# Patient Record
Sex: Female | Born: 1940 | Race: White | Hispanic: No | Marital: Married | Smoking: Never smoker
Health system: Southern US, Community
[De-identification: ages and names within clinical notes are randomized; demographics above are authoritative.]

## PROBLEM LIST (undated history)

## (undated) DIAGNOSIS — H547 Unspecified visual loss: Secondary | ICD-10-CM

## (undated) DIAGNOSIS — H9190 Unspecified hearing loss, unspecified ear: Secondary | ICD-10-CM

## (undated) DIAGNOSIS — R413 Other amnesia: Secondary | ICD-10-CM

## (undated) HISTORY — DX: Unspecified hearing loss, unspecified ear: H91.90

## (undated) HISTORY — DX: Unspecified visual loss: H54.7

## (undated) HISTORY — DX: Other amnesia: R41.3

## (undated) HISTORY — PX: BREAST SURGERY: SHX581

---

## 2014-01-31 ENCOUNTER — Ambulatory Visit (INDEPENDENT_AMBULATORY_CARE_PROVIDER_SITE_OTHER): Payer: Self-pay | Admitting: Internal Medicine

## 2014-01-31 ENCOUNTER — Encounter: Payer: Self-pay | Admitting: Internal Medicine

## 2014-01-31 VITALS — BP 118/78 | HR 60 | Ht 64.0 in | Wt 157.0 lb

## 2014-01-31 DIAGNOSIS — H9190 Unspecified hearing loss, unspecified ear: Secondary | ICD-10-CM

## 2014-01-31 DIAGNOSIS — H9193 Unspecified hearing loss, bilateral: Secondary | ICD-10-CM

## 2014-01-31 DIAGNOSIS — G4730 Sleep apnea, unspecified: Secondary | ICD-10-CM

## 2014-01-31 DIAGNOSIS — R413 Other amnesia: Secondary | ICD-10-CM

## 2014-01-31 NOTE — Patient Instructions (Signed)
Appointment made for sleep apnea consultation with Dr. Shelle Iron October 27. Followup with physician in [place] regarding memory loss

## 2014-01-31 NOTE — Progress Notes (Signed)
   Subjective:    Patient ID: Natalie Williamson, female    DOB: 06/04/1940, 73 y.o.   MRN: [id number]  HPI  73 year old White Female from [place], mother of Ophie Burrowes, not seen since 2009. Patient says she was recently evaluated in [place] for some problems with memory loss. She's been visiting her daughter here and has not returned for lab results yet. She resides in [place], [place]. Her daughter called recently and asked that we evaluate her for possible sleep apnea. Patient says that she's been told by her sister and her daughter that she snores and has episodes of apnea. This was noticed on a trip to [place]. Patient says that some 20 years ago she was told in [place] that she had some episodes of sleep apnea. She was never given a CPAP machine.  Past medical history: Fractured hand 2013. Benign breast biopsy 1969.  No known drug allergies. No chronic medications.  Social history: She is a Investment banker, corporate. She is a widow. Does not smoke or consume alcohol.  Family history: Father died of cancer. Mother died of heart disease and had diabetes. One brother age 32 in good health. One sister whose health status is unknown.  She wears hearing aids.    Review of Systems     Objective:   Physical Exam  Vitals reviewed. Constitutional: She is oriented to person, place, and time. She appears well-developed and well-nourished. No distress.  HENT:  Head: Normocephalic and atraumatic.  Right Ear: External ear normal.  Left Ear: External ear normal.  Mouth/Throat: Oropharynx is clear and moist. No oropharyngeal exudate.  Eyes: Conjunctivae and EOM are normal. Pupils are equal, round, and reactive to light. Right eye exhibits no discharge. Left eye exhibits no discharge. No scleral icterus.  Neck: Neck supple. No JVD present. No thyromegaly present.  Cardiovascular: Normal rate, regular rhythm and normal heart sounds.   No murmur heard. Pulmonary/Chest: Effort normal and  breath sounds normal. No respiratory distress. She has no wheezes. She has no rales. She exhibits no tenderness.  Musculoskeletal: She exhibits no edema.  Lymphadenopathy:    She has no cervical adenopathy.  Neurological: She is alert and oriented to person, place, and time. She has normal reflexes. She displays normal reflexes. No cranial nerve deficit. Coordination normal.  Skin: Skin is warm and dry. She is not diaphoretic.  Psychiatric: She has a normal mood and affect. Her behavior is normal. Judgment and thought content normal.          Assessment & Plan:  Probable sleep apnea based on history  History of memory loss-workup in progress in [place]  Plan: Advise patient to be sure physician in [place] checks B12, folate and thyroid functions. She may need neurological consultation/MRI of brain regarding memory loss. Patient prefers to see consultant here regarding sleep apnea. We'll arrange consultation.  Addendum: Appointment made March 15, 2014  Dr. Shelle Iron

## 2014-03-15 ENCOUNTER — Institutional Professional Consult (permissible substitution): Payer: Self-pay | Admitting: Pulmonary Disease

## 2014-04-25 ENCOUNTER — Telehealth: Payer: Self-pay

## 2014-04-25 NOTE — Telephone Encounter (Signed)
Left message for patient to call office to let us know if/when they received flu vaccine. 

## 2014-05-20 DIAGNOSIS — F0280 Dementia in other diseases classified elsewhere without behavioral disturbance: Secondary | ICD-10-CM

## 2014-05-20 HISTORY — DX: Dementia in other diseases classified elsewhere, unspecified severity, without behavioral disturbance, psychotic disturbance, mood disturbance, and anxiety: F02.80

## 2014-06-14 ENCOUNTER — Encounter: Payer: Self-pay | Admitting: Internal Medicine

## 2014-06-14 ENCOUNTER — Ambulatory Visit (INDEPENDENT_AMBULATORY_CARE_PROVIDER_SITE_OTHER): Payer: Self-pay | Admitting: Internal Medicine

## 2014-06-14 VITALS — BP 128/70 | HR 80 | Temp 98.0°F | Wt 159.0 lb

## 2014-06-14 DIAGNOSIS — G4733 Obstructive sleep apnea (adult) (pediatric): Secondary | ICD-10-CM

## 2014-06-14 DIAGNOSIS — R413 Other amnesia: Secondary | ICD-10-CM

## 2014-06-14 DIAGNOSIS — H9193 Unspecified hearing loss, bilateral: Secondary | ICD-10-CM

## 2014-06-14 LAB — POCT URINALYSIS DIPSTICK
Bilirubin, UA: NEGATIVE
Glucose, UA: NEGATIVE
KETONES UA: NEGATIVE
Leukocytes, UA: NEGATIVE
Nitrite, UA: NEGATIVE
PROTEIN UA: NEGATIVE
RBC UA: NEGATIVE
Spec Grav, UA: 1.015
Urobilinogen, UA: NEGATIVE
pH, UA: 5

## 2014-06-14 LAB — CBC WITH DIFFERENTIAL/PLATELET
Basophils Absolute: 0 10*3/uL (ref 0.0–0.1)
Basophils Relative: 0 % (ref 0–1)
EOS PCT: 1 % (ref 0–5)
Eosinophils Absolute: 0.1 10*3/uL (ref 0.0–0.7)
HCT: 39.3 % (ref 36.0–46.0)
Hemoglobin: 13.2 g/dL (ref 12.0–15.0)
Lymphocytes Relative: 31 % (ref 12–46)
Lymphs Abs: 1.9 10*3/uL (ref 0.7–4.0)
MCH: 29.7 pg (ref 26.0–34.0)
MCHC: 33.6 g/dL (ref 30.0–36.0)
MCV: 88.3 fL (ref 78.0–100.0)
MONO ABS: 0.4 10*3/uL (ref 0.1–1.0)
MONOS PCT: 7 % (ref 3–12)
MPV: 9.5 fL (ref 8.6–12.4)
Neutro Abs: 3.8 10*3/uL (ref 1.7–7.7)
Neutrophils Relative %: 61 % (ref 43–77)
Platelets: 232 10*3/uL (ref 150–400)
RBC: 4.45 MIL/uL (ref 3.87–5.11)
RDW: 14.3 % (ref 11.5–15.5)
WBC: 6.2 10*3/uL (ref 4.0–10.5)

## 2014-06-14 LAB — FOLATE: Folate: 17.6 ng/mL

## 2014-06-14 LAB — COMPREHENSIVE METABOLIC PANEL
ALBUMIN: 4.5 g/dL (ref 3.5–5.2)
ALT: 17 U/L (ref 0–35)
AST: 24 U/L (ref 0–37)
Alkaline Phosphatase: 56 U/L (ref 39–117)
BILIRUBIN TOTAL: 0.7 mg/dL (ref 0.2–1.2)
BUN: 18 mg/dL (ref 6–23)
CO2: 28 mEq/L (ref 19–32)
Calcium: 9.8 mg/dL (ref 8.4–10.5)
Chloride: 102 mEq/L (ref 96–112)
Creat: 0.87 mg/dL (ref 0.50–1.10)
Glucose, Bld: 95 mg/dL (ref 70–99)
Potassium: 4 mEq/L (ref 3.5–5.3)
Sodium: 140 mEq/L (ref 135–145)
Total Protein: 7.3 g/dL (ref 6.0–8.3)

## 2014-06-14 LAB — VITAMIN B12: Vitamin B-12: 578 pg/mL (ref 211–911)

## 2014-06-14 LAB — RPR

## 2014-06-14 LAB — TSH: TSH: 1.13 u[IU]/mL (ref 0.350–4.500)

## 2014-06-14 LAB — T4, FREE: Free T4: 1.1 ng/dL (ref 0.80–1.80)

## 2014-06-15 DIAGNOSIS — G4733 Obstructive sleep apnea (adult) (pediatric): Secondary | ICD-10-CM | POA: Insufficient documentation
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
## 2014-06-15 NOTE — Patient Instructions (Signed)
Await records from [place] for review. Nonfasting labs drawn and are pending.

## 2014-06-17 ENCOUNTER — Telehealth: Payer: Self-pay | Admitting: *Deleted

## 2014-06-17 DIAGNOSIS — R413 Other amnesia: Secondary | ICD-10-CM

## 2014-06-17 NOTE — Telephone Encounter (Signed)
Spoke with Vernona RiegerLaura at [facility] she states they require 2 years of records and any testing done in last 2 years faxed to them for review. We will forward our records and those we received from [place] to them at [phone]. They will review and contact patient and/or family to schedule appt. They are booked into May of this year.

## 2014-07-12 ENCOUNTER — Telehealth: Payer: Self-pay | Admitting: *Deleted

## 2014-07-12 NOTE — Telephone Encounter (Signed)
Annabelle HarmanDana from [facility] at [facility] called they have scheduled patient with Dr Carl BestEd Shaw on 08/22/2014 at 10:30 for eval and treat memory loss

## 2014-09-06 ENCOUNTER — Telehealth: Payer: Self-pay | Admitting: Internal Medicine

## 2014-09-06 NOTE — Telephone Encounter (Signed)
Spoke with daughter, recommended looking online coupons.  Also recommended that they ask the docs at [facility] if they have samples or if perhaps their Pharmaceutical Reps may be able to provide some of the meds since patient is self pay.  The name of the medicine is Exelon Rivastigmine.

## 2014-09-06 NOTE — Telephone Encounter (Signed)
She may be able to get coupon on line

## 2014-09-06 NOTE — Telephone Encounter (Signed)
Daughter, Theodoro Parmaerry Williamson called to advise that patient was seen at [facility].  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  She scored a 27 out of 30 on the test.  She is to [facility] Friday, 4/29 for a Glucose Test; CT Scan to make a definitive diagnosis between the two.  Meanwhile, they started her on a low dosage of an altheimer's drug.  Natalie couldn't remember the exact name of the drug, but it was very expensive.  I asked her if it was Namenda, but she couldn't be certain.  She wanted you to be informed of what is going on.

## 2014-10-18 ENCOUNTER — Telehealth: Payer: Self-pay | Admitting: Internal Medicine

## 2014-10-18 NOTE — Telephone Encounter (Signed)
Was seen this morning at [facility] by Dr. Raynelle FanningJulie (? She cannot remember the last name).  Today is her last day there.  She introduced them to the next doc but she will be leaving before the patient goes back in August (the new doc will be leaving in June).  So, they really do not know who they will be seeing in August when they are to return to the clinic in August for follow up.  Ms. 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  There is no R or Front side involvement.  She has been placed on Axon medication (they MAY switch her to another drug IF this one hurts her stomach); but they don't have a Rx for the other altzheimer's Rx.    Natalie Williamson is wanting to come back to you now because they are not supposed to go back to [facility] until August.  She feels that they have been given a LOT of information today and it has been dumped into their laps.  They don't know how fast the disease could/will progress.  They don't know what to anticipate, what to expect.  They are VERY overwhelmed.  Natalie Williamson wants to know what she should look for (like the next 10 steps) as she progresses.  A timeline...the next 2 years, the next 5 years, etc.  At what point do they take her license?  How do they act proactively?  In a while she won't be able to read or speak.   So, at a point, she won't be able to speak or communicate although she will be able to see (because her brain won't be able to connect due to the disease) -- all information that was just dumped in their laps today.    The reports/information should be sent to you from [facility]....but she wanted to call and keep you informed as well.  She is quite overwhelmed.

## 2014-10-18 NOTE — Telephone Encounter (Signed)
Family needs to call Chairman of Department and discuss these concerns. I do not have any recent reports from [facility]

## 2014-10-18 NOTE — Telephone Encounter (Signed)
Left message on daughter voice mail with instructions to call over to [facility] to that department for clarification of information and any other questions or concerns they may have.

## 2015-04-11 ENCOUNTER — Encounter: Payer: Self-pay | Admitting: Internal Medicine

## 2015-04-11 ENCOUNTER — Ambulatory Visit (INDEPENDENT_AMBULATORY_CARE_PROVIDER_SITE_OTHER): Payer: Self-pay | Admitting: Internal Medicine

## 2015-04-11 VITALS — BP 124/80 | HR 68 | Temp 98.3°F | Resp 20 | Wt 156.0 lb

## 2015-04-11 DIAGNOSIS — T148 Other injury of unspecified body region: Secondary | ICD-10-CM

## 2015-04-11 DIAGNOSIS — T148XXA Other injury of unspecified body region, initial encounter: Secondary | ICD-10-CM

## 2015-04-11 DIAGNOSIS — W5581XA Bitten by other mammals, initial encounter: Secondary | ICD-10-CM

## 2015-04-19 NOTE — Progress Notes (Signed)
   Subjective:    Patient ID: Natalie NajjarPatricia A Williamson, female    DOB: Mar 27, 1941, 74 y.o.   MRN: [id number]  HPI 74 year old White Female, mother of Chrisandra Carotaerry Stefanick Cadieux was taken to [facility] November 16 after suffering multiple animal bites face and neck. Family reported the patient was leaning over to [facility] coworker's dog when dog bit her. Family reported that all vaccinations were up-to-date. Patient denied facial numbness or weakness. Patient has a history of memory loss which is been evaluated at [facility]. She was found to have a large superficial abrasion to the right cheek. There were several linear abrasions to the right lateral neck with several partial-thickness puncture wounds to the sternocleidomastoid muscle belly. None of the wounds required surgical repair. She had a CT angioma of the neck showing normal carotid and vertebral arteries. No evidence of contrast extravasation or arterial injury. Soft tissue swelling noted in right face. She received IV Unasyn in the emergency department. Patient was discharged home on Augmentin for 10 days 500/125. She's done well since that time. She even travel to [facility] [place] [place] to see the final [place] race of the year.    Review of Systems     Objective:   Physical Exam  Abrasion to right cheek and puncture wounds described to right face and right neck as above. No evidence of drainage or redness or secondary infection at this point in time. She is alert and oriented.      Assessment & Plan:  Multiple animal bites right face and neck with no evidence of secondary bacterial infection at this point in time  Plan: Finish course of Augmentin and keep an eye out for infection. Tetanus immunization is up-to-date.  25 minutes spent with patient and daughter.

## 2015-04-19 NOTE — Patient Instructions (Signed)
Watch for infection. Finish course of Augmentin. Call if any concerns or questions.

## 2016-08-13 ENCOUNTER — Telehealth: Payer: Self-pay | Admitting: Internal Medicine

## 2016-08-13 DIAGNOSIS — A09 Infectious gastroenteritis and colitis, unspecified: Secondary | ICD-10-CM

## 2016-08-13 NOTE — Telephone Encounter (Signed)
Previous pharmacy used was [facility] [address] (now a [facility]  Called in Zofran 4mg  #20, no refill.  Take 1 tablet every 8 hours as needed for nausea.  Verbal order by Dr. Lenord FellersBaxley.   Left voicemail for patient's daughter at [phone] with directions for the medication.  And, advised that we do NOT want the patient to become dedydrated this evening.  She is instructed to call the office tomorrow with any questions.

## 2016-08-13 NOTE — Telephone Encounter (Signed)
Call in  Zofran 4 mg #20 tablets one by mouth every 8 hours when necessary nausea. Encourage clear liquids to keep well hydrated.

## 2016-08-13 NOTE — Telephone Encounter (Signed)
Called daughter, Aurther Lofterry to advise that Dr. Lenord FellersBaxley wants to call in Zofran for her Mom.  Left message on her voicemail @ [phone] to please call the office to provide a pharmacy for her Mom so that we can call in a Rx for the nausea for the patient.  Will wait for her call.

## 2016-08-13 NOTE — Telephone Encounter (Signed)
Daughter, Aurther Lofterry called stating that her Mother flew (patient) in last Wednesday.  She "thinks" that patient may have some norovirus or food poisoning.  She got her a lemonade from [employer] last evening.  It sat on the counter from about 6:00 p.m. Until about 9:00 p.m.  She then drank it.  Aurther Lofterry states that it has lemonade and some milk products such as ice cream mixed in it.  Today, patient has vomited 3-4 times this morning and has had a low grade fever.  She has been in the bed for most of the day.  She gave her some motrin.  She wanted to know if she should bring her to the office?  Advised there isn't much we can do for this.  She should stay on a clear liquid diet.  Patient doesn't have any cough or congestion, no other complaints.    Advised that you will not be in the office on Wednesday, but will be in the office on Thursday morning.  Advised that I would document her chart of the conversation.  She just wanted you to be aware in the event that she needed to be seen on Thursday.    FYI.

## 2019-06-09 ENCOUNTER — Encounter: Payer: Self-pay | Admitting: Internal Medicine

## 2019-06-09 ENCOUNTER — Other Ambulatory Visit: Payer: Self-pay

## 2019-06-09 ENCOUNTER — Ambulatory Visit (INDEPENDENT_AMBULATORY_CARE_PROVIDER_SITE_OTHER): Payer: Self-pay | Admitting: Internal Medicine

## 2019-06-09 VITALS — BP 102/60 | HR 65 | Temp 98.0°F | Wt 170.0 lb

## 2019-06-09 DIAGNOSIS — R413 Other amnesia: Secondary | ICD-10-CM

## 2019-06-09 DIAGNOSIS — F419 Anxiety disorder, unspecified: Secondary | ICD-10-CM

## 2019-06-09 MED ORDER — CLONAZEPAM 0.5 MG PO TABS: 0.5000 mg | ORAL_TABLET | Freq: Two times a day (BID) | ORAL | 1 refills | Status: DC | PRN

## 2019-06-09 MED ORDER — DONEPEZIL HCL 10 MG PO TABS: 10.0000 mg | ORAL_TABLET | Freq: Every morning | ORAL | 1 refills | Status: DC

## 2019-06-09 NOTE — Patient Instructions (Addendum)
It was a pleasure to see you again.  Please take Klonopin on an as-needed basis for anxiety/panic disorder.  Aricept has been refilled.  We understand he will be returning back home to [place] in early February.

## 2019-06-09 NOTE — Progress Notes (Signed)
   Subjective:    Patient ID: Natalie Williamson, female    DOB: 19-Jun-1940, 79 y.o.   MRN: [id number]  HPI 79 year old Female not seen since November 2016. Patient resides in [place] and has been visiting her daughter here in [place] recently. Her daughter,Terry Stefan Karen, is a patient here.  Patient and her daughter walked in today asking for refill on generic Aricept.  Patient  was seen here January 2016 and diagnosed with memory loss.  She had work up in [place] for this as well prior to her evaluation here in 2016. History of sleep apnea and wear CPAP device to sleep. History of hearing loss and has hearing aids.Subsequently in 2016 was seen at [facility] and was started on Exelon patch.   She brings empty bottle of generic Aricept with her today. Has recently had some episodes of anxiety- one episode seemed to stem from having issues with CPAP device becoming very anxious and unconsolable. EMS has been called to the home twice but other than elevated BP with agitation and anxiety was found to be stable. BP here is normal today.    Review of Systems apparently sleeps fairly well and appetite is good.  She is pleasant and cooperative.     Objective:   Physical Exam VS 102/60, pulse 65, Temp 98 degrees pulse ox 96% weight 170 pounds.  Pleasant Female in no acute distress.  Knows year, month, oriented to self, recognizes daughter.  She is cooperative and nonagitated.  Skin warm and dry.  Nodes none.  Neck is supple without JVD thyromegaly or carotid bruits.  Chest is clear to auscultation.  Cardiac exam regular rate and rhythm normal S1 and S2 without murmurs or gallops.  No lower extremity edema.  Her affect is pleasant and cooperative.  Occult episode continue with anxious and relates it to being frightened about some issue with her CPAP device as the doctor in [place] told her if she did not wear it she would die.  This seemed to trigger a panic attack.  She does not seem to have frequent  panic attacks according to her daughter.       Assessment & Plan:  History of memory loss status post evaluation at [facility] and also in [place].  Currently being maintained on Aricept 10 mg daily generic and this was refilled.  Anxiety/panic disorder-explained to daughter that this is not unusual with history of memory loss.  Have prescribed Klonopin 0.5 mg to take at onset of anxiety/panic symptoms rather than on a daily basis.  Patient is planning to return to [place] in early February.  30 minutes spent with patient and daughter and reviewing chart, taking history, examining patient, medical decision making

## 2019-08-31 ENCOUNTER — Other Ambulatory Visit: Payer: Self-pay | Admitting: Internal Medicine

## 2019-08-31 ENCOUNTER — Other Ambulatory Visit: Payer: Self-pay

## 2019-08-31 DIAGNOSIS — Z1329 Encounter for screening for other suspected endocrine disorder: Secondary | ICD-10-CM

## 2019-08-31 DIAGNOSIS — R413 Other amnesia: Secondary | ICD-10-CM

## 2019-08-31 DIAGNOSIS — H9190 Unspecified hearing loss, unspecified ear: Secondary | ICD-10-CM

## 2019-08-31 DIAGNOSIS — Z0000 Encounter for general adult medical examination without abnormal findings: Secondary | ICD-10-CM

## 2019-08-31 DIAGNOSIS — G4733 Obstructive sleep apnea (adult) (pediatric): Secondary | ICD-10-CM

## 2019-08-31 DIAGNOSIS — Z13220 Encounter for screening for lipoid disorders: Secondary | ICD-10-CM

## 2019-09-01 LAB — COMPLETE METABOLIC PANEL WITH GFR
AG Ratio: 1.8 (calc) (ref 1.0–2.5)
ALT: 16 U/L (ref 6–29)
AST: 21 U/L (ref 10–35)
Albumin: 4.3 g/dL (ref 3.6–5.1)
Alkaline phosphatase (APISO): 61 U/L (ref 37–153)
BUN/Creatinine Ratio: 25 (calc) — ABNORMAL HIGH (ref 6–22)
BUN: 25 mg/dL (ref 7–25)
CO2: 26 mmol/L (ref 20–32)
Calcium: 9.3 mg/dL (ref 8.6–10.4)
Chloride: 106 mmol/L (ref 98–110)
Creat: 0.99 mg/dL — ABNORMAL HIGH (ref 0.60–0.93)
GFR, Est African American: 63 mL/min/{1.73_m2} (ref >=60)
GFR, Est Non African American: 54 mL/min/{1.73_m2} — ABNORMAL LOW (ref >=60)
Globulin: 2.4 g/dL (calc) (ref 1.9–3.7)
Glucose, Bld: 94 mg/dL (ref 65–99)
Potassium: 5 mmol/L (ref 3.5–5.3)
Sodium: 141 mmol/L (ref 135–146)
Total Bilirubin: 1 mg/dL (ref 0.2–1.2)
Total Protein: 6.7 g/dL (ref 6.1–8.1)

## 2019-09-01 LAB — CBC WITH DIFFERENTIAL/PLATELET
Absolute Monocytes: 396 cells/uL (ref 200–950)
Basophils Absolute: 22 cells/uL (ref 0–200)
Basophils Relative: 0.5 %
Eosinophils Absolute: 79 cells/uL (ref 15–500)
Eosinophils Relative: 1.8 %
HCT: 38.3 % (ref 35.0–45.0)
Hemoglobin: 12.6 g/dL (ref 11.7–15.5)
Lymphs Abs: 1324 cells/uL (ref 850–3900)
MCH: 29.9 pg (ref 27.0–33.0)
MCHC: 32.9 g/dL (ref 32.0–36.0)
MCV: 90.8 fL (ref 80.0–100.0)
MPV: 10.3 fL (ref 7.5–12.5)
Monocytes Relative: 9 %
Neutro Abs: 2578 cells/uL (ref 1500–7800)
Neutrophils Relative %: 58.6 %
Platelets: 205 10*3/uL (ref 140–400)
RBC: 4.22 10*6/uL (ref 3.80–5.10)
RDW: 13.7 % (ref 11.0–15.0)
Total Lymphocyte: 30.1 %
WBC: 4.4 10*3/uL (ref 3.8–10.8)

## 2019-09-01 LAB — LIPID PANEL
Cholesterol: 258 mg/dL — ABNORMAL HIGH (ref <200)
HDL: 63 mg/dL (ref >=50)
LDL Cholesterol (Calc): 175 mg/dL (calc) — ABNORMAL HIGH
Non-HDL Cholesterol (Calc): 195 mg/dL (calc) — ABNORMAL HIGH (ref <130)
Total CHOL/HDL Ratio: 4.1 (calc) (ref <5.0)
Triglycerides: 88 mg/dL (ref <150)

## 2019-09-01 LAB — TSH: TSH: 0.68 mIU/L (ref 0.40–4.50)

## 2019-09-03 ENCOUNTER — Other Ambulatory Visit: Payer: Self-pay

## 2019-09-03 ENCOUNTER — Ambulatory Visit (INDEPENDENT_AMBULATORY_CARE_PROVIDER_SITE_OTHER): Payer: Self-pay | Admitting: Internal Medicine

## 2019-09-03 ENCOUNTER — Encounter: Payer: Self-pay | Admitting: Internal Medicine

## 2019-09-03 VITALS — BP 120/80 | HR 82 | Temp 98.3°F | Ht 63.0 in | Wt 165.0 lb

## 2019-09-03 DIAGNOSIS — Z0000 Encounter for general adult medical examination without abnormal findings: Secondary | ICD-10-CM

## 2019-09-04 ENCOUNTER — Telehealth: Payer: Self-pay | Admitting: Internal Medicine

## 2019-09-04 NOTE — Telephone Encounter (Signed)
Her labs have been reviewed and a copy mailed to her daughter [name] here in [place]. Only concern is cholesterol elevated. She had Covid exposure with her son and has received 2 Covid vaccines but last one was very recent. Therefore, per protocols, we felt it was best not to see her yesterday. I am willing to see her in 2 weeks provided she has no Covid-19 symptoms and that daughter brings her. Have mailed note to daughter with results.

## 2019-09-15 ENCOUNTER — Encounter: Payer: Self-pay | Admitting: Internal Medicine

## 2019-09-15 NOTE — Patient Instructions (Addendum)
Patient not seen due to possible Covid exposure with her son who was hospitalized with COVID-19.  Daughter says she has had 1 Covid vaccine and the most recent Covid vaccine was less than 2 weeks ago.  We did not agree to see you today based on this history.  Her labs have been drawn previously without his knowledge.  Her daughter has been mailed a copy of these labs and we offered to see the patient in 2 weeks if she remains asymptomatic.  Her son was not pleased that we would not see patient today.

## 2019-09-27 ENCOUNTER — Telehealth: Payer: Self-pay | Admitting: Internal Medicine

## 2019-09-27 NOTE — Telephone Encounter (Signed)
error 

## 2019-09-29 ENCOUNTER — Other Ambulatory Visit: Payer: Self-pay

## 2019-09-29 ENCOUNTER — Ambulatory Visit (INDEPENDENT_AMBULATORY_CARE_PROVIDER_SITE_OTHER): Payer: Self-pay | Admitting: Internal Medicine

## 2019-09-29 ENCOUNTER — Ambulatory Visit
Admission: RE | Admit: 2019-09-29 | Discharge: 2019-09-29 | Disposition: A | Discharge status: Home / Self Care | Payer: No Typology Code available for payment source | Source: Ambulatory Visit | Attending: Internal Medicine | Admitting: Internal Medicine

## 2019-09-29 ENCOUNTER — Encounter: Payer: Self-pay | Admitting: Internal Medicine

## 2019-09-29 VITALS — BP 110/60 | HR 68 | Ht 63.0 in | Wt 167.0 lb

## 2019-09-29 DIAGNOSIS — R413 Other amnesia: Secondary | ICD-10-CM

## 2019-09-29 DIAGNOSIS — R8290 Unspecified abnormal findings in urine: Secondary | ICD-10-CM

## 2019-09-29 DIAGNOSIS — E7800 Pure hypercholesterolemia, unspecified: Secondary | ICD-10-CM

## 2019-09-29 DIAGNOSIS — Z0000 Encounter for general adult medical examination without abnormal findings: Secondary | ICD-10-CM

## 2019-09-29 DIAGNOSIS — H903 Sensorineural hearing loss, bilateral: Secondary | ICD-10-CM

## 2019-09-29 DIAGNOSIS — L989 Disorder of the skin and subcutaneous tissue, unspecified: Secondary | ICD-10-CM

## 2019-09-29 DIAGNOSIS — Z23 Encounter for immunization: Secondary | ICD-10-CM

## 2019-09-29 DIAGNOSIS — G4733 Obstructive sleep apnea (adult) (pediatric): Secondary | ICD-10-CM

## 2019-09-29 LAB — POCT URINALYSIS DIPSTICK
Bilirubin, UA: NEGATIVE
Blood, UA: NEGATIVE
Glucose, UA: NEGATIVE
Ketones, UA: NEGATIVE
Nitrite, UA: NEGATIVE
Protein, UA: NEGATIVE
Spec Grav, UA: 1.015 (ref 1.010–1.025)
Urobilinogen, UA: 0.2 E.U./dL
pH, UA: 6 (ref 5.0–8.0)

## 2019-09-29 MED ORDER — ROSUVASTATIN CALCIUM 5 MG PO TABS: 5.0000 mg | ORAL_TABLET | Freq: Every day | ORAL | 3 refills | Status: DC

## 2019-09-29 MED ORDER — MEMANTINE HCL ER 14 MG PO CP24: 14.0000 mg | ORAL_CAPSULE | Freq: Every day | ORAL | 0 refills | Status: DC

## 2019-09-29 NOTE — Patient Instructions (Signed)
Chest x-ray for TB screening for retirement facility is negative.  Pneumococcal 23 vaccine given today.  Form completed for retirement facility.  Urine culture obtained as dipstick UA is abnormal but she has no urinary symptoms.  Continue Aricept 10 mg daily and add Namenda XR 14 mg daily.  Order written for oxygen/CPAP machine to be evaluated by home health/respiratory company.

## 2019-09-29 NOTE — Progress Notes (Signed)
Subjective:    Patient ID: Natalie Williamson, female    DOB: [birth year], 79 y.o.   MRN: [id number]  HPI  79 year old Female, native of [place], currently residing with her daughter,Terry, here in [place] seen today for health maintenance exam and evaluation of medical issues.I initially saw her in 2007-09-19. I last saw her in January 2021.  She has a history of sleep apnea and memory loss. She has a portable oxygen device that she uses for sleep. It may need titration. Order written for evaluation of device by Home Health Oxygen supply company such as [facility].   She had extensive evaluation for memory loss in 05/02/2016at [facility] in [place] by Dr. Mayford Knife.  At that time, she scored 28 out of 30 on MMSE.  She was found to have memory impairment for her age.  She remembered 0 out of 15 words on the AV LLT word list after 30-minute delay.  Performance was 2 standard deviations below the mean for her age.  Her language function was normal for her age and education.  She named 27 out of 30 items on the [employer].  She has had an MRI of the brain in 09-18-2009 showing right knee punctate white matter foci 3 on the right and 1 on the left supratentorial brain and questionable frontal lobe likely related to small vessel ischemic change.  Also has hearing loss and wears bilateral hearing aids.    Subsequently had PET scan at [facility] in 09-19-2014 showing hypometabolism in the left parietal, left posterior temporal, and left lateral occipital lobe.  Had normal glucose metabolism in bilateral frontal lobes, majority of the right hemicortex, medial occipital lobes and left anterior temporal lobe, anterior and posterior cingulate gyri and basal ganglia.  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  She was placed on Exelon 1.5 mg capsules twice daily at [facility] in 2014-09-19 but there was some abdominal discomfort and  she quit taking the medication. I saw her in January 2021 and placed her on Aricept 10 mg daily.  Social history: She is a Clinical research associate.  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  She is a widow.  She has an adult son and an adult daughter who both live in the [facility]- [address] area.  Has been staying with her daughter for the past 6 or 7 months.  Completed 12 years of school.  Husband passed away in 09-18-05.  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  She has never smoked and does not drink alcohol.  Past medical history: History of right hand fracture in 09/19/11.  Family history: History of Alzheimer's disease in paternal grandmother.  Daughter and son feels that patient has lost ground in terms of memory since being here during COVID-19 over the past few months.  Feels that memory is worse.  Confusion at times, issues with retaining information, and sometimes misplacing things needs more assistance.  Does not drive around town.  Review of Systems-intermittent discomfort in her right upper quadrant with change in position.  She feels that there is some sort of muscle issue and that she has to basically place her fist into her right upper quadrant to get relief of a ? Spasm or catch in that area.  Pain is promptly relieved with the maneuver she is able  to perform.  Denies reflux symptoms.  Somewhat hard of hearing despite wearing bilateral hearing aids.  She wears glasses.  She has developed a small papular lesion on her right cheek.  Have arranged for dermatologist to see her next week.  It may need to be shave biopsied.  Daughter prepares her food and helps her with bathing.  She has had no falls.  Denies being depressed and seems content.  Would like to move back to [place].  We discussed whether or not she should be permitted to reside alone.  Certainly a life alert device would be recommended in case she fell while she  was alone.  There is a neighbor next door who has been helpful.  She has also investigated a retirement facility in [place].  There are forms which will be completed today for that facility.  It is not known if she ever had pneumonia vaccines.  Pneumococcal 23 vaccine given today.  She has had 2 COVID-19 vaccines September 01, 2019 and also on August 12, 2019.  She had tetanus immunization November 2016.     Objective:   Physical Exam vital signs temperature 98.3 degrees orally weight 165 pounds loss of 5 pounds since January 2021.  Pulse oximetry 96% on room air.  BMI 29.23 temperature 98.3 degrees orally height 5 feet 3 inches Blood pressure 120/80, pulse 82 and regular  Skin:warm and dry.  Small papular lesion right cheek.  No cervical adenopathy.  TMs and pharynx are clear.  She wears bilateral hearing aids.  Neck is supple without JVD thyromegaly or carotid bruits.  Chest is clear to auscultation.  Cardiac exam regular rate and rhythm normal S1 and S2 without murmurs or gallops.  Breast without masses although there are fibrocystic changes in each breast bilaterally.  Abdomen soft nondistended without hepatosplenomegaly masses or tenderness.  Pap deferred due to age.  Bimanual is normal.  No lower extremity pitting edema.  Neurological exam: She is alert, pleasant and cooperative.  She is unable to do serial 7 calculations.  Unable to remember 3 items after 5 minutes.  Does not he have month or year correctly.  Somewhat hard of hearing despite hearing aids and asked that I repeat myself frequently although we are both wearing masks during the interview.     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  Significant issues with recall of items after a few minutes.  Tried on Exelon but that caused GI disturbance and patient is currently on Aricept 10 mg daily.  As of today, we will start her also on Namenda (memantine)XR 14 mg daily today.  TSH is within  normal limits.  MCV is normal.  There is no anemia.  Creatinine is stable at 0.99.  CBC is normal.  Urine dipstick shows small LE.  Culture was sent.  I do not think she is depressed.  She is pleasant and cooperative.  Pure hypercholesterolemia-total cholesterol is 258 with an LDL of 175.  She eats pretty healthy according to her daughter.  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  Bilateral hearing loss-wears bilateral hearing aids  History of sleep apnea-has oxygen device that is portable and will be checked out by home health/respiratory company that can assist with supplies etc.  Health maintenance: Pneumococcal 23 vaccine given today.  She has had 2 COVID-19 vaccines.  Has never had colonoscopy.  Cologuard would be reasonable  but she is planning to move back to [place].  Also, retirement facility is requesting TB clearance.  Chest x-ray performed today shows no evidence of TB.  Has never had colonoscopy.  Could do 3 Hemoccult cards at home or Cologuard.  These would be reasonable screenings for colon cancer at her age.  Small lesion right cheek-rule out basal cell carcinoma. Could be benign keratosis. Appointment made with dermatologist next week.  Abnormal urine dipstick-culture sent.  Could perhaps not be a good clean-catch.  No urinary symptoms.  5 pound weight loss since January-needs to be monitored

## 2019-09-30 LAB — URINE CULTURE
MICRO NUMBER:: 10469155
SPECIMEN QUALITY:: ADEQUATE

## 2020-05-16 ENCOUNTER — Other Ambulatory Visit: Payer: Self-pay

## 2020-05-16 ENCOUNTER — Telehealth: Payer: Self-pay | Admitting: Internal Medicine

## 2020-05-16 DIAGNOSIS — R142 Eructation: Secondary | ICD-10-CM

## 2020-05-16 DIAGNOSIS — R140 Abdominal distension (gaseous): Secondary | ICD-10-CM

## 2020-05-16 NOTE — Telephone Encounter (Signed)
Is Natalie Williamson here or in [place]?

## 2020-05-16 NOTE — Telephone Encounter (Signed)
Natalie Williamson [phone]  [name] called to say her mom is having really bad gas and is burping for 30-40 minutes to where she almost passes out, after she drinks or eats. They have even changed to almond milk to see if that would help. The burping started about a month ago and has gotten really bad in the last 2 weeks. This is now happening almost every day, she is really concerned about it.

## 2020-05-16 NOTE — Telephone Encounter (Signed)
Patient is staying here and taking turns with her children. Called and spoke with [name], patient will go and have Xray in the morning they will get the Nexium 20 mg and start her on that once a day and schedule office visit next week.

## 2020-05-16 NOTE — Telephone Encounter (Signed)
Scheduled

## 2020-05-17 ENCOUNTER — Ambulatory Visit: Payer: Self-pay | Admitting: Internal Medicine

## 2020-05-17 ENCOUNTER — Telehealth: Payer: Self-pay | Admitting: Internal Medicine

## 2020-05-17 ENCOUNTER — Ambulatory Visit
Admission: RE | Admit: 2020-05-17 | Discharge: 2020-05-17 | Disposition: A | Discharge status: Home / Self Care | Payer: No Typology Code available for payment source | Source: Ambulatory Visit | Attending: Internal Medicine | Admitting: Internal Medicine

## 2020-05-17 DIAGNOSIS — R140 Abdominal distension (gaseous): Secondary | ICD-10-CM

## 2020-05-17 DIAGNOSIS — R142 Eructation: Secondary | ICD-10-CM

## 2020-05-17 NOTE — Telephone Encounter (Signed)
LVM to C/B for results

## 2020-05-17 NOTE — Telephone Encounter (Signed)
[name] called back and will get Miralax and Nexium for patient and we scheduled appointment for 05/29/2020

## 2020-05-17 NOTE — Telephone Encounter (Signed)
-----   Message from Margaree Mackintosh, MD sent at 05/17/2020 10:40 AM EST ----- Natalie Williamson has mild constipation. Let's see if Miralax daily will help- takes 7-10 days. Also OTC Nexium may help burping but she may have a condition known as aerophagia where she is swallowing air but does not realize it.

## 2020-05-29 ENCOUNTER — Encounter: Payer: Self-pay | Admitting: Internal Medicine

## 2020-05-29 ENCOUNTER — Other Ambulatory Visit: Payer: Self-pay

## 2020-05-29 ENCOUNTER — Ambulatory Visit (INDEPENDENT_AMBULATORY_CARE_PROVIDER_SITE_OTHER): Payer: Self-pay | Admitting: Internal Medicine

## 2020-05-29 VITALS — BP 160/80 | HR 86 | Temp 98.6°F | Ht 63.0 in | Wt 160.0 lb

## 2020-05-29 DIAGNOSIS — F458 Other somatoform disorders: Secondary | ICD-10-CM

## 2020-05-29 DIAGNOSIS — R413 Other amnesia: Secondary | ICD-10-CM

## 2020-05-29 DIAGNOSIS — H903 Sensorineural hearing loss, bilateral: Secondary | ICD-10-CM

## 2020-05-29 NOTE — Patient Instructions (Signed)
She is scheduled to return to [place] tomorrow.  I have added Namenda 14 mg daily to Aricept 10 mg daily.  Perhaps physician in [place] can assess her aerophagia.

## 2020-05-29 NOTE — Progress Notes (Signed)
Subjective:    Patient ID: Natalie Williamson, female    DOB: 1941-02-21, 80 y.o.   MRN: [id number]  HPI 80 year old Female visiting family here from [place] with history of memory loss. She was seen here in May 2021 for health maintenance exam and evaluation of medical issues.  She has a history of sleep apnea.  In addition to memory loss, she has hearing loss and wears bilateral hearing aids.  She had extensive evaluation for memory loss in April 2016 at [facility] in [place].  See dictation 01/30/2020 for details.  She was tried on Exelon capsules but apparently had some abdominal discomfort.  In January 2021, placed her on Aricept 10 mg daily.  She is scheduled to return to [place] tomorrow.  She is now living in a facility for elderly individuals and has her own condominium.  However no one checks on her from the facility.  The facility has several levels of care.  She is an independent care.  She is pleasant and cooperative.  Recently has had some issues with frequent deep burping and belching.  She had a KUB recently to rule out constipation on December 29.  She has mild colorectal stool burden.  I placed her on MiraLAX and Nexium to see if these medications would help the burping.  Family did not think these helped very much and in fact she had an episode of fecal incontinence this past weekend.  They stopped giving her these medications.  Her daughter has a video of her burping and belching in the car.  She was on her way to [facility] to see her grandson play hockey.  Sometimes they think it may be an emotional thing.  It is possible she has aerophagia.  It may be an antianxiety medication would be of some help but I would prefer that they obtain that from her [place] physician.  She also had an episode recently a couple of days after her COVID booster where her right hand was stiff for period of time.  I am wondering if she could have had possibly a TIA.  Her son thought  it might be related to the COVID-vaccine.    Review of Systems see above- no issues with behavior.  She is cooperative.     Objective:   Physical Exam Blood pressure 160/80 recheck was 120/80  Pulse 86 regular temperature 98.6 degrees pulse oximetry 98% weight 160 pounds.  She has lost 7 pounds from May 2021.  She is alert and cooperative.  Her neck is supple without JVD thyromegaly or carotid bruits.  Her chest is clear to auscultation.  Cardiac exam regular rate and rhythm.  1/6 to 2/6 systolic ejection murmur.  Abdomen soft nondistended without hepatosplenomegaly masses or tenderness.  No lower extremity pitting edema.  She thinks the year is 2021.       Assessment & Plan:  Belching and burping-?  Aerophagia.  We could refer her to Gastroenterology for evaluation but she is scheduled to go to [place] tomorrow.  We did try Nexium in addition to MiraLAX for this issue which I thought might have some element of constipation and in fact her KUB did show a moderate stool burden.  She is currently not taking these 2 medications.  ?  Right hand stiffness and numbness-?  Etiology unclear but possibly could have been a TIA  History of memory loss-currently on Aricept 10 mg daily.  Add Namenda 14 mg every 24 hours.  Ask family to have physician follow-up with her in [place].  Hearing loss-wears bilateral hearing aids and family would like to have new ones for her.  It would likely be cheaper in [place] given socialized medicine there compared to the [place].

## 2020-10-18 ENCOUNTER — Telehealth: Payer: Self-pay | Admitting: Internal Medicine

## 2020-10-18 NOTE — Telephone Encounter (Signed)
Scheduled labs, have not schedule appointment yet

## 2020-10-18 NOTE — Telephone Encounter (Signed)
Natalie Williamson [phone]  [name] called to say they were going to be placing [name] in [facility] as early as this coming Saturday. However she needs form and mini physical completed first. I had form dropped off, so we could look at it. I did let [name] know we were booked this week and you were out of office next week for continuing education, and I did not know if we could do anything before week after next.

## 2020-10-18 NOTE — Telephone Encounter (Signed)
She needs fasting labs and brief PE. Can we work out something tomorrow so we can have labs and form ready to go Friday? I can come in early.

## 2020-10-19 ENCOUNTER — Other Ambulatory Visit: Payer: Self-pay | Admitting: Internal Medicine

## 2020-10-19 ENCOUNTER — Other Ambulatory Visit: Payer: Self-pay

## 2020-10-19 DIAGNOSIS — E785 Hyperlipidemia, unspecified: Secondary | ICD-10-CM

## 2020-10-19 DIAGNOSIS — R7989 Other specified abnormal findings of blood chemistry: Secondary | ICD-10-CM

## 2020-10-19 DIAGNOSIS — Z0000 Encounter for general adult medical examination without abnormal findings: Secondary | ICD-10-CM

## 2020-10-19 NOTE — Telephone Encounter (Signed)
Schedule appointment?

## 2020-10-20 ENCOUNTER — Ambulatory Visit (INDEPENDENT_AMBULATORY_CARE_PROVIDER_SITE_OTHER): Payer: Self-pay | Admitting: Internal Medicine

## 2020-10-20 ENCOUNTER — Encounter: Payer: Self-pay | Admitting: Internal Medicine

## 2020-10-20 VITALS — BP 110/60 | HR 72 | Ht 62.25 in | Wt 162.0 lb

## 2020-10-20 DIAGNOSIS — H903 Sensorineural hearing loss, bilateral: Secondary | ICD-10-CM

## 2020-10-20 DIAGNOSIS — R413 Other amnesia: Secondary | ICD-10-CM

## 2020-10-20 DIAGNOSIS — Z0289 Encounter for other administrative examinations: Secondary | ICD-10-CM

## 2020-10-20 DIAGNOSIS — Z0000 Encounter for general adult medical examination without abnormal findings: Secondary | ICD-10-CM

## 2020-10-20 LAB — CBC WITH DIFFERENTIAL/PLATELET
Absolute Monocytes: 360 cells/uL (ref 200–950)
Basophils Absolute: 20 cells/uL (ref 0–200)
Basophils Relative: 0.5 %
Eosinophils Absolute: 80 cells/uL (ref 15–500)
Eosinophils Relative: 2 %
HCT: 37.2 % (ref 35.0–45.0)
Hemoglobin: 12.2 g/dL (ref 11.7–15.5)
Lymphs Abs: 1392 cells/uL (ref 850–3900)
MCH: 29.7 pg (ref 27.0–33.0)
MCHC: 32.8 g/dL (ref 32.0–36.0)
MCV: 90.5 fL (ref 80.0–100.0)
MPV: 10.4 fL (ref 7.5–12.5)
Monocytes Relative: 9 %
Neutro Abs: 2148 cells/uL (ref 1500–7800)
Neutrophils Relative %: 53.7 %
Platelets: 191 10*3/uL (ref 140–400)
RBC: 4.11 10*6/uL (ref 3.80–5.10)
RDW: 13.4 % (ref 11.0–15.0)
Total Lymphocyte: 34.8 %
WBC: 4 10*3/uL (ref 3.8–10.8)

## 2020-10-20 LAB — COMPLETE METABOLIC PANEL WITH GFR
AG Ratio: 1.7 (calc) (ref 1.0–2.5)
ALT: 12 U/L (ref 6–29)
AST: 17 U/L (ref 10–35)
Albumin: 4.3 g/dL (ref 3.6–5.1)
Alkaline phosphatase (APISO): 66 U/L (ref 37–153)
BUN/Creatinine Ratio: 21 (calc) (ref 6–22)
BUN: 23 mg/dL (ref 7–25)
CO2: 29 mmol/L (ref 20–32)
Calcium: 9.3 mg/dL (ref 8.6–10.4)
Chloride: 105 mmol/L (ref 98–110)
Creat: 1.09 mg/dL — ABNORMAL HIGH (ref 0.60–0.88)
GFR, Est African American: 56 mL/min/{1.73_m2} — ABNORMAL LOW (ref >=60)
GFR, Est Non African American: 48 mL/min/{1.73_m2} — ABNORMAL LOW (ref >=60)
Globulin: 2.5 g/dL (calc) (ref 1.9–3.7)
Glucose, Bld: 86 mg/dL (ref 65–99)
Potassium: 4.3 mmol/L (ref 3.5–5.3)
Sodium: 142 mmol/L (ref 135–146)
Total Bilirubin: 1.1 mg/dL (ref 0.2–1.2)
Total Protein: 6.8 g/dL (ref 6.1–8.1)

## 2020-10-20 LAB — LIPID PANEL
Cholesterol: 239 mg/dL — ABNORMAL HIGH (ref <200)
HDL: 56 mg/dL (ref >=50)
LDL Cholesterol (Calc): 163 mg/dL (calc) — ABNORMAL HIGH
Non-HDL Cholesterol (Calc): 183 mg/dL (calc) — ABNORMAL HIGH (ref <130)
Total CHOL/HDL Ratio: 4.3 (calc) (ref <5.0)
Triglycerides: 94 mg/dL (ref <150)

## 2020-10-20 LAB — POCT URINALYSIS DIPSTICK
Appearance: NEGATIVE
Bilirubin, UA: NEGATIVE
Blood, UA: NEGATIVE
Glucose, UA: NEGATIVE
Ketones, UA: NEGATIVE
Leukocytes, UA: NEGATIVE
Nitrite, UA: NEGATIVE
Odor: NEGATIVE
Protein, UA: NEGATIVE
Spec Grav, UA: 1.015 (ref 1.010–1.025)
Urobilinogen, UA: 0.2 E.U./dL
pH, UA: 6 (ref 5.0–8.0)

## 2020-10-20 LAB — TSH: TSH: 1.17 mIU/L (ref 0.40–4.50)

## 2020-11-20 NOTE — Patient Instructions (Signed)
Labs reviewed.  She has pure hypercholesterolemia.  Continues to show signs of memory loss which has worsened over the past couple of years.  Form completed for entry to [facility] living facility.  Continues to wear hearing aids for longstanding history of hearing loss.

## 2020-11-20 NOTE — Progress Notes (Signed)
   Subjective:    Patient ID: Natalie Williamson, female    DOB: Feb 17, 1941, 80 y.o.   MRN: [id number]  HPI 80 year old Female with dementia is moving from Assisted Living Facility in [place] to [place] to be closer to her daughter and her son.  Daughter lives here in [place].  A form needs to be completed for her to enter [facility].  She has a history of sleep apnea.  She has a portable oxygen device that she uses for sleep.  She had extensive evaluation for memory loss in 05/05/16at [facility] by Dr. Mayford Knife.  At that time she scored 28 out of 30 on MMSE.  Was found to have memory impairment for her age.  Performance was 2 standard deviations below the mean for her age.  She had PET scan at [facility] 2014/09/22 showing hypermetabolism in the left parietal, left posterior temporal and left lateral occipital lobe.  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  She was placed on Exelon capsules but there was some abdominal discomfort and she quit taking this.  In January 2021, I placed her on Aricept.  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  Husband passed away in Sep 21, 2005.  She has been a regular Charity fundraiser.  She has never smoked and does not drink alcohol.  An adult son and adult daughter both live in the [address] areas.  Patient has a history of hearing loss and wears hearing aids.  History of right hand fracture in 09-22-2011.  Family history: History of Alzheimer's disease in paternal grandmother.  Daughter has felt that memory got worse during the COVID-19 pandemic.  Confusion at times, issues with retaining information and sometimes misplacing things.  Labs reviewed.  Creatinine mildly elevated at 1.09.  She is not anemic.  Total cholesterol 239 with an LDL cholesterol of 163.  HDL is 56 and  triglycerides 94.  TSH is normal.  Dipstick UA is normal.  CBC is normal.  Review of Systems no new complaints     Objective:   Physical Exam She is hard of hearing.  Blood pressure is 110/60 pulse 72 regular pulse oximetry 97% weight 162 pounds BMI 29.39  Skin: Warm and dry.  No cervical adenopathy.  Chest clear to auscultation.  Cardiac exam: Regular rate and rhythm.  Abdomen soft nondistended without hepatosplenomegaly masses or tenderness.  No masses on bimanual exam.  No lower extremity edema.  Neuro no gross focal deficits but definitely has memory issues.  Cannot remember 3 items after 5 minutes.  Trouble with serial sevens.       Assessment & Plan:  Memory loss-has been on Namenda and Aricept in the past.  Defer treatment for physician at [facility] for this  Hearing loss-wears bilateral hearing aids  Pure hypercholesterolemia-daughter does not want her on cholesterol-lowering medication  Plan: Form completed for entry to [facility].

## 2020-12-14 ENCOUNTER — Telehealth: Payer: Self-pay

## 2020-12-14 NOTE — Telephone Encounter (Signed)
Natalie Williamson called states at her mom's last visit she talked to you about her mom's CPAP she said her mom has one but is very old and she doesn't even know whether it works she would like to know about how to get a new one.

## 2020-12-14 NOTE — Telephone Encounter (Signed)
LVM on Natalie Williamson's cell that a referral was placed to [facility] and if she does not here from them in about a week to call them to schedule an appointment, because they are the ones they would need to handle anything to do with CPAP machine and supplies.

## 2020-12-18 ENCOUNTER — Telehealth: Payer: Self-pay | Admitting: Internal Medicine

## 2020-12-18 NOTE — Telephone Encounter (Signed)
Called [name] offered to see patient tomorrow at 10:00am, and [name] said she is going to be in [place] until Thursday evening she asked to be seen on Friday. Appt made.

## 2020-12-18 NOTE — Telephone Encounter (Signed)
Natalie Williamson [phone]  [name] called to say her mom has found a hard bump over her right breast, you can just feel it, not see it.

## 2020-12-22 ENCOUNTER — Other Ambulatory Visit: Payer: Self-pay

## 2020-12-22 ENCOUNTER — Ambulatory Visit (INDEPENDENT_AMBULATORY_CARE_PROVIDER_SITE_OTHER): Payer: Self-pay | Admitting: Internal Medicine

## 2020-12-22 ENCOUNTER — Encounter: Payer: Self-pay | Admitting: Internal Medicine

## 2020-12-22 VITALS — BP 140/80 | HR 78 | Temp 98.6°F | Ht 62.25 in | Wt 163.0 lb

## 2020-12-22 DIAGNOSIS — Z8669 Personal history of other diseases of the nervous system and sense organs: Secondary | ICD-10-CM

## 2020-12-22 DIAGNOSIS — Z1239 Encounter for other screening for malignant neoplasm of breast: Secondary | ICD-10-CM

## 2020-12-22 DIAGNOSIS — L814 Other melanin hyperpigmentation: Secondary | ICD-10-CM

## 2020-12-22 DIAGNOSIS — R413 Other amnesia: Secondary | ICD-10-CM

## 2020-12-22 NOTE — Progress Notes (Signed)
   Subjective:    Patient ID: Natalie Williamson, female    DOB: Dec 01, 1940, 80 y.o.   MRN: [id number]  HPI 80 year old Female seen today for 3 reasons:  Daughter accompanies her today. Patient has memory loss and has recently moved to [place] from a retirement facility in [place]. She is a native of [place] and both her son and daughter reside in this area. She is a widow. The family was involved in [employer] in [place] for a number of years.  Patient has a CPAP device that she obtained in [place]. Daughter and I looked at it today. I cannot tell what setting it is on. She has an appointment with Pulmonary soon. I cannot order a new device or supplies without correct information.She may need a sleep study.  She had an episode recently at [facility] where she became upset. It seemed to have something to do with her bra that was difficult to put on. She also thought a man at breakfast was looking at her inappropriately. She was found in her closet by daughter and appeared very frightened but really could not explain herself very well. Her daughter stayed and played some religious music and eventually Natalie Williamson calmed down.Daughter has arranged for Staff to give her mother medications instead of trusting Natalie Williamson to take her meds correctly. Daughter wondering what to do about her mother's mood. She may be depressed. It may be her dementia has flared up. I have sent in new Rx for Aricept 10 mg daily. She took Namenda last year but that Rx may have expired. She could take both but I am adding Paxil 10 mg daily today to help with agitation and possible depression.  The third reason she is here today is that she has a skin lesion on her right breast upper aspect that is brownich and has an irregular border. [facility] and treatment of this lesion.      Review of Systems see above. No complaint of headache, chest pain ,SOB,  musculoskeletal pain     Objective:   Physical Exam BP 140/80, pulse 78, Temp 98.6 degrees, pulse ox 98% Weight 163 pounds, BMI 29.57  Right breast has brownish irregular lesion upper aspect. Chest is clear. Cor RRR No LE edema. Affect is pleasant and cooperative although slightly confused today and may be confabulating at times.No mass on right breast exam.       Assessment & Plan:  Sleep apnea- to see Pulmonologist for assistance in prescribing correct settings and device for patient. Her equipment is old.  Memory loss- may be developing some behavior issues/ anxiety. Adding Paxil 10 mg daily. Have prescribed Aricept 10 mg daily. See how she does on these for a few weeks and can add Namenda if needed.TSH is normal.  Right [facility]- [facility]. Have ordered mammogram. Do not feel any masses on breast exam. ? Lentigo  Pure hypercholesterolemia- do not treat with lipid lowering med at this time.

## 2020-12-24 NOTE — Progress Notes (Signed)
12/25/20- 79 yoF never smoker for sleep evaluation courtesy of Sharlet Salina, MD with concern of OSA/ CPAP Medical problem list includes OSA, Hearing Loss, Memory Loss, Hypercholesterolemia,  Here with son and daughter who filled out her forms and answered many of the questions.  Epworth score-6 Body weight today- 166 lbs Covid vax- Lives at [facility]. Dr Lenord Fellers had written for O2/ CPAP to be evaluated by DME. Has klonopin 0.5 mg prn panic associated with memory loss.  She had been consistently using a CPAP machine which is about 81 years old, from [place] and based on a [place] sleep study. Family asks about direct purchase of replacement as potentially cheaper than going through DME.  No insurance/ no Medicare. Family described hx snoring before CPAP.  Prior to Admission medications   Medication Sig Start Date End Date Taking? Authorizing Provider  memantine (NAMENDA XR) 14 MG CP24 24 hr capsule Take 1 capsule (14 mg total) by mouth daily. 09/29/19  Yes Baxley, Luanna Cole, MD  donepezil (ARICEPT) 10 MG tablet Take 1 tablet (10 mg total) by mouth every morning. 12/25/20   Margaree Mackintosh, MD  PARoxetine (PAXIL) 10 MG tablet Take 1 tablet (10 mg total) by mouth daily. 12/25/20   Margaree Mackintosh, MD   Past Medical History:  Diagnosis Date   Hearing difficulty    Memory loss    Past Surgical History:  Procedure Laterality Date   BREAST SURGERY     Family History  Problem Relation Age of Onset   Arthritis Mother    Diabetes Mother    Headache Mother    Cancer Father    Social History   Socioeconomic History   Marital status: Married    Spouse name: Not on file   Number of children: Not on file   Years of education: Not on file   Highest education level: Not on file  Occupational History   Not on file  Tobacco Use   Smoking status: Never   Smokeless tobacco: Never  Substance and Sexual Activity   Alcohol use: No   Drug use: No   Sexual activity: Not on file  Other Topics Concern    Not on file  Social History Narrative   Not on file   Social Determinants of Health   Financial Resource Strain: Not on file  Food Insecurity: Not on file  Transportation Needs: Not on file  Physical Activity: Not on file  Stress: Not on file  Social Connections: Not on file  Intimate Partner Violence: Not on file   ROS-see HPI   + = positive Constitutional:    weight loss, night sweats, fevers, chills, fatigue, lassitude. HEENT:    headaches, difficulty swallowing, tooth/dental problems, sore throat,       sneezing, itching, ear ache, nasal congestion, post nasal drip, snoring CV:    chest pain, orthopnea, PND, swelling in lower extremities, anasarca,                                  dizziness, palpitations Resp:   shortness of breath with exertion or at rest.                productive cough,   non-productive cough, coughing up of blood.              change in color of mucus.  wheezing.   Skin:    rash or lesions. GI:  No-   heartburn, indigestion, abdominal pain, nausea, vomiting, diarrhea,                 change in bowel habits, loss of appetite GU: dysuria, change in color of urine, no urgency or frequency.   flank pain. MS:   joint pain, stiffness, decreased range of motion, back pain. Neuro-     nothing unusual Psych:  change in mood or affect.  depression or anxiety.   memory loss.  OBJ- Physical Exam General- Alert, Oriented, Affect-appropriate, Distress- none acute, +obese Skin- rash-none, lesions- none, excoriation- none Lymphadenopathy- none Head- atraumatic            Eyes- Gross vision intact, PERRLA, conjunctivae and secretions clear            Ears- Hearing, canals-normal            Nose- Clear, no-Septal dev, mucus, polyps, erosion, perforation             Throat- Mallampati III , mucosa clear , drainage- none, tonsils- atrophic, +teeth Neck- flexible , trachea midline, no stridor , thyroid nl, carotid no bruit Chest - symmetrical excursion , unlabored            Heart/CV- RRR , no murmur , no gallop  , no rub, nl s1 s2                           - JVD- none , edema- none, stasis changes- none, varices- none           Lung- clear to P&A, wheeze- none, cough- none , dullness-none, rub- none           Chest wall-  Abd-  Br/ Gen/ Rectal- Not done, not indicated Extrem- cyanosis- none, clubbing, none, atrophy- none, strength- nl Neuro- grossly intact to observation

## 2020-12-25 ENCOUNTER — Telehealth: Payer: Self-pay | Admitting: Internal Medicine

## 2020-12-25 ENCOUNTER — Ambulatory Visit (INDEPENDENT_AMBULATORY_CARE_PROVIDER_SITE_OTHER): Payer: No Typology Code available for payment source | Admitting: Internal Medicine

## 2020-12-25 ENCOUNTER — Encounter: Payer: Self-pay | Admitting: Internal Medicine

## 2020-12-25 ENCOUNTER — Other Ambulatory Visit: Payer: Self-pay

## 2020-12-25 VITALS — BP 108/70 | HR 76 | Temp 98.3°F | Ht 63.5 in | Wt 166.2 lb

## 2020-12-25 DIAGNOSIS — R413 Other amnesia: Secondary | ICD-10-CM

## 2020-12-25 DIAGNOSIS — G4733 Obstructive sleep apnea (adult) (pediatric): Secondary | ICD-10-CM

## 2020-12-25 MED ORDER — PAROXETINE HCL 10 MG PO TABS: 10.0000 mg | ORAL_TABLET | Freq: Every day | ORAL | 0 refills | Status: DC

## 2020-12-25 MED ORDER — DONEPEZIL HCL 10 MG PO TABS: 10.0000 mg | ORAL_TABLET | Freq: Every morning | ORAL | 1 refills | Status: DC

## 2020-12-25 NOTE — Patient Instructions (Signed)
Order- schedule home sleep test    dx OSA  Please call us for results and recommendations about 2 weeks after your sleep test. We can help with a prescription so you can get a new machine and supplies on line as discussed.  Our Patient Care Coordinators will contact Emrie Gayle (dtr) at [phone] to schedule this.

## 2020-12-25 NOTE — Telephone Encounter (Signed)
Notified [name] that Dr. Lenord Fellers has sent those prescriptions.

## 2020-12-25 NOTE — Telephone Encounter (Signed)
Natalie Williamson [phone]  [name] called to check on Anxiety and alzheimer medication that was going to be called in from her moms visit on Friday.  [facility] [id number] [place], [place] - [address] Phone:  [phone]  Fax:  [phone]

## 2020-12-26 NOTE — Assessment & Plan Note (Signed)
She lives in assisted living and has supportive family

## 2020-12-26 NOTE — Assessment & Plan Note (Signed)
She had slept well with CPAP. Needs updated study to replace machine. Plan- home sleep test, anticipating she will spend that night at daughter's house so daughter can help her. If she needs CPAP, we can help them get it on-line, rather than DME, as they ask.

## 2020-12-27 NOTE — Patient Instructions (Addendum)
To see Pulmonologist about sleep apnea. [facility] right breast- ? Possible lentigo. Start Paxil 10 mg daily and Aricept 10 mg daily for memory loss and agitation.RTC 3 months. Mammogram ordered.

## 2020-12-28 NOTE — Telephone Encounter (Signed)
Natalie Williamson has called back and said she thought her mom was supposed to also get medication for cholesterol too. She also said that one of the medications that her mom has started on is causing her stomach to be upset. Her mom is thinking it is the Alzheimer mediation.

## 2020-12-28 NOTE — Telephone Encounter (Signed)
Called and told Terrie what Dr Lenord Fellers said and she verbalized understanding

## 2021-02-21 ENCOUNTER — Other Ambulatory Visit: Payer: Self-pay

## 2021-02-21 ENCOUNTER — Ambulatory Visit: Payer: No Typology Code available for payment source

## 2021-02-21 DIAGNOSIS — G4733 Obstructive sleep apnea (adult) (pediatric): Secondary | ICD-10-CM

## 2021-03-02 DIAGNOSIS — G4733 Obstructive sleep apnea (adult) (pediatric): Secondary | ICD-10-CM

## 2021-03-15 ENCOUNTER — Other Ambulatory Visit: Payer: Self-pay | Admitting: Internal Medicine

## 2021-04-03 ENCOUNTER — Telehealth: Payer: Self-pay | Admitting: Internal Medicine

## 2021-04-03 DIAGNOSIS — G4733 Obstructive sleep apnea (adult) (pediatric): Secondary | ICD-10-CM

## 2021-04-03 NOTE — Telephone Encounter (Signed)
Home sleep test showed moderate obstructive sleep apnea with low oxygen levels. I recommend we order new DME, new CPAP autto 5-15, mask of choice, humidifier, supplies, AirView/ card

## 2021-04-03 NOTE — Telephone Encounter (Signed)
Dr Young, please advise on HST results, thanks! 

## 2021-04-04 NOTE — Telephone Encounter (Signed)
I have called the pts daughter and she is aware of CY recs.  She stated that the pt does not have insurance so she is self pay.  She is currently using a cpap that is about 81 years old and does not get any supplies from anyone.  Nothing further is needed.

## 2021-06-07 ENCOUNTER — Telehealth: Payer: Self-pay | Admitting: Internal Medicine

## 2021-06-07 NOTE — Telephone Encounter (Signed)
ATC patient and got patient's daughter [name]. We got a fax from [employer] that she has been set up with CPAP machine and was going to get her scheduled for follow up. Daughter stated that they are self pay so there is no insurance requirement and would like to wait a week or so to see if patient is even going to like it/tolerate it and if so then she will call us back to schedule an appointment. Nothing further needed at this time.

## 2021-07-09 ENCOUNTER — Other Ambulatory Visit: Payer: Self-pay | Admitting: Optometrist

## 2021-07-09 ENCOUNTER — Telehealth: Payer: Self-pay | Admitting: Internal Medicine

## 2021-07-09 DIAGNOSIS — H53461 Homonymous bilateral field defects, right side: Secondary | ICD-10-CM

## 2021-07-09 NOTE — Telephone Encounter (Signed)
Pt daughter called and said they went to the eye doctor and they think that pt has had a mini stroke or multiple and believes that is what has happened with pts eye site and they want to get an MRI, Pt is hoping Dr Renold Genta has any of the scans pt had for her head from 2016. Pt daughter said they are going to send over release for those images. Pts daughters call back if needed is [phone]. She said she just wants to give Dr Renold Genta a heads up on this.

## 2021-07-09 NOTE — Telephone Encounter (Signed)
I called pt daughter back and let her know that we dont have any images to compare because that's what she is wanting done. She said she will call [facility] to see if they have them. I did let her know as well that Dr Renold Genta would not be able to make the comparison and that she would probably refer to a neurologist but that she would need an appt with Dr Renold Genta before she does. Per Dr Renold Genta

## 2021-07-10 ENCOUNTER — Ambulatory Visit
Admission: RE | Admit: 2021-07-10 | Discharge: 2021-07-10 | Disposition: A | Discharge status: Home / Self Care | Payer: No Typology Code available for payment source | Source: Ambulatory Visit | Attending: Optometrist | Admitting: Optometrist

## 2021-07-10 ENCOUNTER — Telehealth: Payer: Self-pay | Admitting: Internal Medicine

## 2021-07-10 ENCOUNTER — Other Ambulatory Visit: Payer: Self-pay

## 2021-07-10 ENCOUNTER — Other Ambulatory Visit: Payer: No Typology Code available for payment source

## 2021-07-10 ENCOUNTER — Encounter: Payer: Self-pay | Admitting: Internal Medicine

## 2021-07-10 DIAGNOSIS — H53461 Homonymous bilateral field defects, right side: Secondary | ICD-10-CM

## 2021-07-10 MED ORDER — GADOBENATE DIMEGLUMINE 529 MG/ML IV SOLN
15.0000 mL | Freq: Once | INTRAVENOUS | Status: AC | PRN
  Administered 2021-07-10: 15 mL via INTRAVENOUS

## 2021-07-10 NOTE — Telephone Encounter (Signed)
Phone call from Dr. Valora Piccolo at [facility] regarding MRI findings negative for stroke. We will see patient on Thursday here for evaluation and Referral to Neurology. Dr. Valora Piccolo making referral to [facility] low vision clinic for visual training.

## 2021-07-10 NOTE — Telephone Encounter (Addendum)
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 I let her know she would need a office visit first. MRI was done at [facility] imaging per request of eye doctor.

## 2021-07-10 NOTE — Telephone Encounter (Signed)
Scheduled appointment

## 2021-07-10 NOTE — Telephone Encounter (Addendum)
Phone call to daughter ,Natalie Williamson and  her son, Harvie Heck. Daughter noticed about a month ago that patient could not see to eat the right side of a sandwich and was bumping into doors on her right. Could not see the handle on right car door either.  Natalie Williamson mentioned this to her  personal eye doctor at [facility]. A visit was advised. Apparently a right hemianopsia was diagnosed but I do not have records from [facility]. Patient had MRI of brain at [facility] today. Awaiting a typed report from Radiology. Family would like Neurology evaluation as soon as possible. Explained we needed to see report. They are hoping to prevent another stroke.  Patient was last seen here in August 2022 and did not have arrythmia at that time.   I will try to get report from [facility] today as well as MRI report.  Addendum: 12:50 pm. No tumor or stroke found on brain MRI. Daughter had been contacted. I will see patient Thursday at noon and will refer to Neurology.

## 2021-07-12 ENCOUNTER — Encounter: Payer: Self-pay | Admitting: Internal Medicine

## 2021-07-12 ENCOUNTER — Ambulatory Visit (INDEPENDENT_AMBULATORY_CARE_PROVIDER_SITE_OTHER): Payer: Self-pay | Admitting: Internal Medicine

## 2021-07-12 ENCOUNTER — Other Ambulatory Visit: Payer: Self-pay

## 2021-07-12 VITALS — BP 126/70 | HR 83 | Temp 97.1°F

## 2021-07-12 DIAGNOSIS — R413 Other amnesia: Secondary | ICD-10-CM

## 2021-07-12 LAB — CBC WITH DIFFERENTIAL/PLATELET
Absolute Monocytes: 344 cells/uL (ref 200–950)
Basophils Absolute: 22 cells/uL (ref 0–200)
Basophils Relative: 0.5 %
Eosinophils Absolute: 39 cells/uL (ref 15–500)
Eosinophils Relative: 0.9 %
HCT: 36.2 % (ref 35.0–45.0)
Hemoglobin: 12.2 g/dL (ref 11.7–15.5)
Lymphs Abs: 1479 cells/uL (ref 850–3900)
MCH: 30.2 pg (ref 27.0–33.0)
MCHC: 33.7 g/dL (ref 32.0–36.0)
MCV: 89.6 fL (ref 80.0–100.0)
MPV: 10.6 fL (ref 7.5–12.5)
Monocytes Relative: 8 %
Neutro Abs: 2417 cells/uL (ref 1500–7800)
Neutrophils Relative %: 56.2 %
Platelets: 211 10*3/uL (ref 140–400)
RBC: 4.04 10*6/uL (ref 3.80–5.10)
RDW: 13.1 % (ref 11.0–15.0)
Total Lymphocyte: 34.4 %
WBC: 4.3 10*3/uL (ref 3.8–10.8)

## 2021-07-12 LAB — COMPLETE METABOLIC PANEL WITH GFR
AG Ratio: 1.5 (calc) (ref 1.0–2.5)
ALT: 10 U/L (ref 6–29)
AST: 16 U/L (ref 10–35)
Albumin: 4.2 g/dL (ref 3.6–5.1)
Alkaline phosphatase (APISO): 67 U/L (ref 37–153)
BUN/Creatinine Ratio: 18 (calc) (ref 6–22)
BUN: 17 mg/dL (ref 7–25)
CO2: 30 mmol/L (ref 20–32)
Calcium: 9.2 mg/dL (ref 8.6–10.4)
Chloride: 107 mmol/L (ref 98–110)
Creat: 0.97 mg/dL — ABNORMAL HIGH (ref 0.60–0.95)
Globulin: 2.8 g/dL (calc) (ref 1.9–3.7)
Glucose, Bld: 89 mg/dL (ref 65–99)
Potassium: 4.1 mmol/L (ref 3.5–5.3)
Sodium: 142 mmol/L (ref 135–146)
Total Bilirubin: 0.9 mg/dL (ref 0.2–1.2)
Total Protein: 7 g/dL (ref 6.1–8.1)
eGFR: 59 mL/min/{1.73_m2} — ABNORMAL LOW (ref >=60)

## 2021-07-12 LAB — B12 AND FOLATE PANEL
Folate: 8.6 ng/mL
Vitamin B-12: 264 pg/mL (ref 200–1100)

## 2021-07-12 LAB — TSH: TSH: 0.69 mIU/L (ref 0.40–4.50)

## 2021-07-12 LAB — T4, FREE: Free T4: 1.1 ng/dL (ref 0.8–1.8)

## 2021-07-12 NOTE — Patient Instructions (Signed)
Labs drawn and pending.  Referral will be made to Neurologist at family request.

## 2021-07-12 NOTE — Progress Notes (Signed)
Subjective:    Patient ID: Natalie Williamson, female    DOB: 11-20-1940, 81 y.o.   MRN: [id number]  HPI 81 year old Female seen today for medical evaluation.  She has been seen recently at [facility] after family noted she was neglecting right side.  Recently had MRI of the brain which was unremarkable for stroke.  She would only eat the left side of a sandwich and had issues finding car door handle on the right.  She is a native of [place] and is a [place].  She resides at [place] at the present time.  Her daughter and son live in the area.  She has her own apartment at [employer].  Memory care has not been suggested to the family yet.  The patient is alert and cooperative.  She is pleasantly demented and does not know day of the week year or month etc.  Dr. Valma Cava at [facility] suggested perhaps patient could be seen at [facility] low vision clinic for visual training.  Family is interested in Neurology referral.  Patient was seen in May 2016 at [place] [facility] at [facility].  Had PET scan showing hypometabolism in left parietal, left posterior temporal and left lateral occipital lobe.  Abnormal glucose metabolism in the bilateral frontal lobes, majority of the right hemicortex, the medial occipital lobes, the left anterior temporal lobe, anterior and posterior cingulate gyri and basal ganglion.  CT angiography of neck done after a dog bite of the right face in November 2016 showed normal carotid and vertebral arteries in the neck and no evidence of arteriosclerotic disease or dissection.  Has soft tissue swelling in the right face compatible with dog bite and laceration.  She was tried on Exelon patch in 08-14-14.  Subsequently returned to [place] to live.  More recently before moving to [place] was in a similar living situation in [place] with an apartment in a retirement facility.  During the pandemic, daughter went back to [place] and  brought patient here to [place].  Social history: She is a widow.  Daughter resides here in [place] and son resides in the [place] area.  Family previously participated in [place] racing.  Her husband died in 08-13-05.  History of obstructive sleep apnea and started CPAP in 08-14-14 but most recently was seen at pulmonary for an adjustment and new mask.  She wears bilateral hearing aids.  Had hand fracture in 08/14/2011.  Was diagnosed with B12 deficiency in 13-Aug-2012 and treated with oral supplementation.  Social history: Completed 12 years of education.  Husband passed away in 08-13-05.  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  Has never smoked and does not drink alcohol.  Family was involved in [place] racing.  Family history: Reported Alzheimer's disease in paternal grandmother.  MRI of the brain on February 21 showed no acute infarction hemorrhage hydrocephalus or mass lesion.  Mild to moderate T2 flair hyperintensities in the white matter compatible with chronic microvascular disease.    Review of Systems see above     Objective:   Physical Exam She is pleasant and cooperative but I am not sure she realizes that I am her primary care physician. Blood pressure is excellent.  Pulse 83 regular pulse oximetry 98%.  Skin is warm and dry.  No cervical adenopathy.  Chest clear.  No lower extremity edema.  She follows commands fairly well.      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  Labs drawn and pending including B12, folate, free T4 TSH CBC and c-Met

## 2021-07-23 ENCOUNTER — Ambulatory Visit (INDEPENDENT_AMBULATORY_CARE_PROVIDER_SITE_OTHER): Payer: Self-pay | Admitting: Neurology

## 2021-07-23 ENCOUNTER — Encounter: Payer: Self-pay | Admitting: Neurology

## 2021-07-23 VITALS — BP 155/78 | HR 76 | Ht 63.5 in | Wt 163.8 lb

## 2021-07-23 DIAGNOSIS — R414 Neurologic neglect syndrome: Secondary | ICD-10-CM

## 2021-07-23 DIAGNOSIS — G4733 Obstructive sleep apnea (adult) (pediatric): Secondary | ICD-10-CM

## 2021-07-23 DIAGNOSIS — G319 Degenerative disease of nervous system, unspecified: Secondary | ICD-10-CM

## 2021-07-23 DIAGNOSIS — R413 Other amnesia: Secondary | ICD-10-CM

## 2021-07-23 MED ORDER — MEMANTINE HCL ER 7 MG PO CP24: ORAL_CAPSULE | ORAL | 5 refills | Status: DC

## 2021-07-23 NOTE — Progress Notes (Signed)
[facility]  PATIENT: Natalie Williamson DOB: 10/06/1940  REFERRING DOCTOR OR PCP: Tedra Senegal, MD SOURCE: Patient, notes from primary care, imaging and lab reports, MRI images personally reviewed.  _________________________________   HISTORICAL  CHIEF COMPLAINT:  Chief Complaint  Patient presents with   New Patient (Initial Visit)    Rm 1, w daughter and son. Internal referral for memory concerns and decreased vision. Dx w Alzheimer in 2016. Pt is here for Alzhemiers management and discuss care and treatment options. MMSE:4    HISTORY OF PRESENT ILLNESS:  I had the pleasure of seeing your patient, Natalie Williamson, at [facility] for neurologic consultation regarding her memory concerns and visual disturbance.  She is an 81 year old woman who notes some problems with memory and other cognitive changes.    Her daughter notes she had difficuly with memory in 2016.   She went to [facility] and had a PET scan.   It showed hypometabolism in the left parietal, left posterior temporal, and left lateral occipital lobe is concerning for posterior cerebral atrophy.    At that time, shescored 26/30 on the MMSE at that time.      Since then, she has had progressive difficuty with cognition .   Over thepast few months she is ignoring the right visual field.    MRI showed no stroke that could explain it.    She had been on donepezil i and rivastigmine n the past but had GI issues.   Memantine was prescribed in the past but she has not taken it.  She feels she sleeps well at night.  She sometimes takes naps.     She denies depression.       She has no other significant medical problems.    She eats well.     MMSE - Mini Mental State Exam 07/23/2021  Orientation to time 0  Orientation to Place 1  Registration 0  Attention/ Calculation 0  Recall 0  Language- name 2 objects 2  Language- repeat 0  Language- follow 3 step command 1  Language- read &  follow direction 0  Write a sentence 0  Copy design 0  Total score 4     IMAGES Personally reviewed: MRI brain 07/10/2021 showed mild to moderate chronic microvascular ischemic changes.   Mild atrophy.   No acute findings.    REVIEW OF SYSTEMS: Constitutional: No fevers, chills, sweats, or change in appetite Eyes: No visual changes, double vision, eye pain Ear, nose and throat: No hearing loss, ear pain, nasal congestion, sore throat Cardiovascular: No chest pain, palpitations Respiratory:  No shortness of breath at rest or with exertion.   No wheezes GastrointestinaI: No nausea, vomiting, diarrhea, abdominal pain, fecal incontinence Genitourinary:  No dysuria, urinary retention or frequency.  No nocturia. Musculoskeletal:  No neck pain, back pain Integumentary: No rash, pruritus, skin lesions Neurological: as above Psychiatric: No depression at this time.  No anxiety Endocrine: No palpitations, diaphoresis, change in appetite, change in weigh or increased thirst Hematologic/Lymphatic:  No anemia, purpura, petechiae. Allergic/Immunologic: No itchy/runny eyes, nasal congestion, recent allergic reactions, rashes  ALLERGIES: Allergies  Allergen Reactions   Other Hives    Heat    HOME MEDICATIONS:  Current Outpatient Medications:    memantine (NAMENDA XR) 7 MG CP24 24 hr capsule, One to three a day as tolerated., Disp: 90 capsule, Rfl: 5  PAST MEDICAL HISTORY: Past Medical History:  Diagnosis Date   Alzheimer disease ([place]) 2016  Hearing difficulty    Memory loss    Vision loss     PAST SURGICAL HISTORY: Past Surgical History:  Procedure Laterality Date   BREAST SURGERY      FAMILY HISTORY: Family History  Problem Relation Age of Onset   Arthritis Mother    Diabetes Mother    Headache Mother    Cancer Father     SOCIAL HISTORY:  Social History   Socioeconomic History   Marital status: Married    Spouse name: Not on file   Number of children: Not on file    Years of education: Not on file   Highest education level: High school graduate  Occupational History   Not on file  Tobacco Use   Smoking status: Never   Smokeless tobacco: Never  Substance and Sexual Activity   Alcohol use: No   Drug use: No   Sexual activity: Not on file  Other Topics Concern   Not on file  Social History Narrative   Lives alone in [place]   Right handed   Social Determinants of Health   Financial Resource Strain: Not on file  Food Insecurity: Not on file  Transportation Needs: Not on file  Physical Activity: Not on file  Stress: Not on file  Social Connections: Not on file  Intimate Partner Violence: Not on file     PHYSICAL EXAM  Vitals:   07/23/21 1117  BP: (!) 155/78  Pulse: 76  Weight: 163 lb 12.8 oz (74.3 kg)  Height: 5' 3.5" (1.613 m)    Body mass index is 28.56 kg/m.   General: The patient is well-developed and well-nourished and in no acute distress  HEENT:  Head is [place]/AT.  Sclera are anicteric.   Neck: No carotid bruits are noted.  The neck is nontender.  Cardiovascular: The heart has a regular rate and rhythm with a normal S1 and S2. There were no murmurs, gallops or rubs.    Skin: Extremities are without rash or  edema.  Musculoskeletal:  Back is nontender  Neurologic Exam  Mental status: The patient is alert and oriented x 3 at the time of the examination. The patient has apparent normal recent and remote memory, with an apparently normal attention span and concentration ability.   Speech is normal.  Cranial nerves: Extraocular movements are full. Pupils are equal, round, and reactive to light and accomodation.  She has reduced visual field on the right and has a denser right visual neglect.  Facial symmetry is present. There is good facial sensation to soft touch bilaterally.Facial strength is normal.  Trapezius and sternocleidomastoid strength is normal. No dysarthria is noted.  The tongue is midline, and the patient  has symmetric elevation of the soft palate. No obvious hearing deficits are noted.  Motor:  Muscle bulk is normal.   Tone is normal. Strength is  5 / 5 in all 4 extremities.   Sensory: She has a right sensory neglect.  Sensory testing is intact to pinprick, soft touch and vibration sensation in all 4 extremities.  Coordination: Cerebellar testing reveals good finger-nose-finger and heel-to-shin bilaterally.  Gait and station: Station is normal.   Gait is normal for age.  Tandem gait is mildly wide but likely normal for age Romberg is negative.   Reflexes: Deep tendon reflexes are symmetric and normal bilaterally.   Plantar responses are flexor.    DIAGNOSTIC DATA (LABS, IMAGING, TESTING) - I reviewed patient records, labs, notes, testing and imaging myself where available.  Lab Results  Component Value Date   WBC 4.3 07/12/2021   HGB 12.2 07/12/2021   HCT 36.2 07/12/2021   MCV 89.6 07/12/2021   PLT 211 07/12/2021      Component Value Date/Time   NA 142 07/12/2021 1251   K 4.1 07/12/2021 1251   CL 107 07/12/2021 1251   CO2 30 07/12/2021 1251   GLUCOSE 89 07/12/2021 1251   BUN 17 07/12/2021 1251   CREATININE 0.97 (H) 07/12/2021 1251   CALCIUM 9.2 07/12/2021 1251   PROT 7.0 07/12/2021 1251   ALBUMIN 4.5 06/14/2014 1214   AST 16 07/12/2021 1251   ALT 10 07/12/2021 1251   ALKPHOS 56 06/14/2014 1214   BILITOT 0.9 07/12/2021 1251   GFRNONAA 48 (L) 10/19/2020 0912   GFRAA 56 (L) 10/19/2020 0912   Lab Results  Component Value Date   CHOL 239 (H) 10/19/2020   HDL 56 10/19/2020   LDLCALC 163 (H) 10/19/2020   TRIG 94 10/19/2020   CHOLHDL 4.3 10/19/2020   No results found for: HGBA1C Lab Results  Component Value Date   VITAMINB12 264 07/12/2021   Lab Results  Component Value Date   TSH 0.69 07/12/2021       ASSESSMENT AND PLAN  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  Over the last year she has had more difficulties with right visual field disturbance and neglect.  On exam, she also has sensory neglect on the right.  Interestingly, she had an FDG glucose PET scan in 2016 that showed hypometabolism in the left occipital, parietal and temporal lobes.  Her MRI shows atrophy is mildly asymmetrical, more on the left.  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  This is a less common variant of Alzheimer's disease that primarily asymmetrically affects the parietal and occipital lobes.  Unfortunately, as with Alzheimer's disease, response to therapy is usually mild.  We had a discussion about therapeutic options including but can you Mab which recently got accelerated approval from the FDA.  Unfortunately, her MMSE score is too advanced to be a candidate.  Additionally the risks may not justify the benefit.  I will have her try memantine..  They will return to see me in about 6 months or sooner if there are new or worsening neurologic symptoms.  We also discussed the importance of safety.  Although she is physically doing well her right-sided neglect places her at an increased risk of falls.  Additional supervision either with help in the home or a memory unit might be appropriate.  Thank you for asking me to see Natalie Williamson.  Please let me know if I can be of further assistance with her or other patients in the future.    Jameson Morrow A. Felecia Shelling, MD, [facility] [id number], [id number] PM Certified in Neurology, Clinical Neurophysiology, Sleep Medicine and Neuroimaging  [facility] [address] [place], [place] [address] [phone]

## 2022-01-30 ENCOUNTER — Encounter: Payer: Self-pay | Admitting: Neurology

## 2022-01-30 ENCOUNTER — Ambulatory Visit (INDEPENDENT_AMBULATORY_CARE_PROVIDER_SITE_OTHER): Payer: Self-pay | Admitting: Neurology

## 2022-01-30 VITALS — BP 157/57 | HR 78 | Ht 63.0 in | Wt 165.4 lb

## 2022-01-30 DIAGNOSIS — R413 Other amnesia: Secondary | ICD-10-CM

## 2022-01-30 DIAGNOSIS — G319 Degenerative disease of nervous system, unspecified: Secondary | ICD-10-CM

## 2022-01-30 DIAGNOSIS — R414 Neurologic neglect syndrome: Secondary | ICD-10-CM

## 2022-01-30 NOTE — Progress Notes (Signed)
[facility]  PATIENT: Natalie Williamson DOB: [birth year]  REFERRING DOCTOR OR PCP: Marlan Palau, MD SOURCE: Patient, notes from primary care, imaging and lab reports, MRI images personally reviewed.  _________________________________   HISTORICAL  CHIEF COMPLAINT:  Chief Complaint  Patient presents with   Follow-up    Pt in Rm#2 with daughter and son in the room. Pt state daughter her mom memories, eye se and hear has decreased since the last time been seen.    HISTORY OF PRESENT ILLNESS:  Natalie Williamson is an 81 year old woman who notes some problems with memory and other cognitive changes.     UPDATE 01/30/2022 Since the last visit, family notes continued progression.  Memantine caused hallucinations and   She stays active and walks about one hour once or twice a day.   She lives at [employer] and has some socialization.   She says she eats fairly healthy and has not gained any weight.  She thinks she stays asleep all night.   She occasionally takes naps.   She denies depression or anxiety.  Occasionally she mistakes what she sees as something else.  It's never scary or leading to agitation.  She has right > left visual neglext.  No auditory hallucinations.   She has trouble seeing everything on a table.    We checked MRI brain 07/10/2021.   It showed mild to moderate generalized atrophy and mild ronic microvascular ischemic changes.        Cognitive History Her daughter notes she had difficuly with memory in 2016.   She went to [facility] and had a PET scan.   It showed hypometabolism in the left parietal, left posterior temporal, and left lateral occipital lobe is concerning for posterior cerebral atrophy.    At that time, she scored 26/30 on the MMSE .    Symptoms slow;y progressed.   In 2022, she was noted to neglect the right visual field.    MRI showed no stroke that could explain it.    She had been on donepezil in and rivastigmine n the past  but had GI issues.   Memantine was prescribed in the past but she has not taken it.      07/23/2021   11:33 AM  MMSE - Mini Mental State Exam  Orientation to time 0  Orientation to Place 1  Registration 0  Attention/ Calculation 0  Recall 0  Language- name 2 objects 2  Language- repeat 0  Language- follow 3 step command 1  Language- read & follow direction 0  Write a sentence 0  Copy design 0  Total score 4     IMAGES Personally reviewed: MRI brain 07/10/2021 showed mild to moderate chronic microvascular ischemic changes.   Mild atrophy.   No acute findings.    REVIEW OF SYSTEMS: Constitutional: No fevers, chills, sweats, or change in appetite Eyes: No visual changes, double vision, eye pain Ear, nose and throat: No hearing loss, ear pain, nasal congestion, sore throat.  As above Cardiovascular: No chest pain, palpitations Respiratory:  No shortness of breath at rest or with exertion.   No wheezes GastrointestinaI: No nausea, vomiting, diarrhea, abdominal pain, fecal incontinence Genitourinary:  No dysuria, urinary retention or frequency.  No nocturia. Musculoskeletal:  No neck pain, back pain Integumentary: No rash, pruritus, skin lesions Neurological: as above Psychiatric: No depression at this time.  No anxiety Endocrine: No palpitations, diaphoresis, change in appetite, change in weigh or increased thirst Hematologic/Lymphatic:  No anemia,  purpura, petechiae. Allergic/Immunologic: No itchy/runny eyes, nasal congestion, recent allergic reactions, rashes  ALLERGIES: Allergies  Allergen Reactions   Other Hives    Heat    HOME MEDICATIONS:  Current Outpatient Medications:    memantine (NAMENDA XR) 7 MG CP24 24 hr capsule, One to three a day as tolerated. (Patient not taking: Reported on 01/30/2022), Disp: 90 capsule, Rfl: 5  PAST MEDICAL HISTORY: Past Medical History:  Diagnosis Date   Alzheimer disease (HCC) 2016   Hearing difficulty    Memory loss    Vision  loss     PAST SURGICAL HISTORY: Past Surgical History:  Procedure Laterality Date   BREAST SURGERY      FAMILY HISTORY: Family History  Problem Relation Age of Onset   Arthritis Mother    Diabetes Mother    Headache Mother    Cancer Father     SOCIAL HISTORY:  Social History   Socioeconomic History   Marital status: Married    Spouse name: Not on file   Number of children: Not on file   Years of education: Not on file   Highest education level: High school graduate  Occupational History   Not on file  Tobacco Use   Smoking status: Never   Smokeless tobacco: Never  Substance and Sexual Activity   Alcohol use: No   Drug use: No   Sexual activity: Not on file  Other Topics Concern   Not on file  Social History Narrative   Lives alone in [place]   Right handed   Social Determinants of Health   Financial Resource Strain: Not on file  Food Insecurity: Not on file  Transportation Needs: Not on file  Physical Activity: Not on file  Stress: Not on file  Social Connections: Not on file  Intimate Partner Violence: Not on file     PHYSICAL EXAM  Vitals:   01/30/22 1339 01/30/22 1344  BP: (!) 162/66 (!) 157/57  Pulse: 79 78  Weight: 165 lb 6.4 oz (75 kg)   Height: 5\' 3"  (1.6 m)     Body mass index is 29.3 kg/m.   General: The patient is well-developed and well-nourished and in no acute distress  HEENT:  Head is Jermyn/AT.  Sclera are anicteric.   Skin: Extremities are without rash or  edema.  Musculoskeletal:  Back is nontender  Neurologic Exam  Mental status: She is alert and oriented to name.  Reduced focus/attention.  Reduced short-term memory.   Speech is normal.  Vision: She has reduced visual field on the right and has a denser right visual neglect.  Cranial nerves: Extraocular movements are full. Pupils are equal, round, and reactive to light and accomodation.    Facial symmetry is present. There is good facial sensation to soft touch  bilaterally.Facial strength is normal.  Trapezius and sternocleidomastoid strength is normal. No dysarthria is noted.    Mild hearing deficits are noted.  Motor:  Muscle bulk is normal.   Tone is normal. Strength is  5 / 5 in all 4 extremities.   Sensory: She has a right sensory neglect.  Sensory testing is intact to pinprick, soft touch and vibration sensation in all 4 extremities.  Coordination: Cerebellar testing reveals good finger-nose-finger and heel-to-shin bilaterally.  Gait and station: Station is normal.   Gait is normal for age.  Tandem gait is mildly wide but likely normal for age Romberg is negative.   Reflexes: Deep tendon reflexes are symmetric and normal bilaterally.  DIAGNOSTIC DATA (LABS, IMAGING, TESTING) - I reviewed patient records, labs, notes, testing and imaging myself where available.  Lab Results  Component Value Date   WBC 4.3 07/12/2021   HGB 12.2 07/12/2021   HCT 36.2 07/12/2021   MCV 89.6 07/12/2021   PLT 211 07/12/2021      Component Value Date/Time   NA 142 07/12/2021 1251   K 4.1 07/12/2021 1251   CL 107 07/12/2021 1251   CO2 30 07/12/2021 1251   GLUCOSE 89 07/12/2021 1251   BUN 17 07/12/2021 1251   CREATININE 0.97 (H) 07/12/2021 1251   CALCIUM 9.2 07/12/2021 1251   PROT 7.0 07/12/2021 1251   ALBUMIN 4.5 06/14/2014 1214   AST 16 07/12/2021 1251   ALT 10 07/12/2021 1251   ALKPHOS 56 06/14/2014 1214   BILITOT 0.9 07/12/2021 1251   GFRNONAA 48 (L) 10/19/2020 0912   GFRAA 56 (L) 10/19/2020 0912   Lab Results  Component Value Date   CHOL 239 (H) 10/19/2020   HDL 56 10/19/2020   LDLCALC 163 (H) 10/19/2020   TRIG 94 10/19/2020   CHOLHDL 4.3 10/19/2020   No results found for: "HGBA1C" Lab Results  Component Value Date   VITAMINB12 264 07/12/2021   Lab Results  Component Value Date   TSH 0.69 07/12/2021       ASSESSMENT AND PLAN  Posterior cortical atrophy (HCC)  Memory loss  Right-sided visual neglect   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 No specific treatment.   She could not tolerates some AD medication Stay active and try to eat healthy Rtc 1 year or sooner if new or worsening symptoms.   Yissel Habermehl A. Epimenio Foot, MD, [facility] 01/30/2022, 2:13 PM Certified in Neurology, Clinical Neurophysiology, Sleep Medicine and Neuroimaging  [facility] [address] [place], [place] [address] [phone]

## 2022-03-21 ENCOUNTER — Telehealth: Payer: Self-pay | Admitting: Internal Medicine

## 2022-03-21 NOTE — Telephone Encounter (Signed)
Maryclaire Stoecker [phone]  Coralyn Mark called to say they are working on getting green cared for [facility] and she needs to get vaccines or proof of. They would like to get what they can here. I did suggest that it would be cheaper and possible free at [facility] department. She said she would check into that.  Tetanus -- not due till 04/04/2025 Hep B MMR Veracella Flu - She has had a facility recently

## 2022-03-22 ENCOUNTER — Encounter: Payer: Self-pay | Admitting: Internal Medicine

## 2022-03-22 ENCOUNTER — Ambulatory Visit (INDEPENDENT_AMBULATORY_CARE_PROVIDER_SITE_OTHER): Payer: Self-pay | Admitting: Internal Medicine

## 2022-03-22 VITALS — BP 122/68 | HR 71 | Temp 98.3°F | Ht 63.0 in | Wt 163.3 lb

## 2022-03-22 DIAGNOSIS — Z7185 Encounter for immunization safety counseling: Secondary | ICD-10-CM

## 2022-03-22 DIAGNOSIS — H903 Sensorineural hearing loss, bilateral: Secondary | ICD-10-CM

## 2022-03-22 DIAGNOSIS — Z23 Encounter for immunization: Secondary | ICD-10-CM

## 2022-03-22 DIAGNOSIS — R413 Other amnesia: Secondary | ICD-10-CM

## 2022-03-22 NOTE — Patient Instructions (Addendum)
Vaccine given- Influenza and Pneumococcal 20. We will continue to monitor BP. It is labile due to age and agitataion Titers were drawn for MMR and are pending at Dr. Michaell Cowing office.

## 2022-03-22 NOTE — Progress Notes (Signed)
Subjective:    Patient ID: Natalie Williamson, female    DOB: 11/26/1940, 81 y.o.   MRN: [id number]  HPI 81 year old Female seen regarding immunizations and labile BP readings.  She has been seen by Dr. Osei-Bonsu in [place] as she is trying to get [place] Citizenship.  Has been noted to have some elevated systolic blood pressure readings at times.  She also may need to have some immunizations.  Her daughter has a list of what is required.  She tells me specifically patient needs pneumococcal vaccine and I am recommending flu vaccine.  Her tetanus immunization is good until 2026.  Some titers have been drawn at Dr. Osei- Bonsu's office regarding MMR status.  She will let me know if patient needs additional vaccines.    Review of Systems no new complaints-wears bilateral hearing aids     Objective:   Physical Exam She is alert and cooperative.  Does appear well.  Chest is clear.  Cardiac exam: Regular rate and rhythm.  No lower extremity edema.  Blood pressure 122/68, pulse 71, temperature 98.3 degrees, pulse oximetry 97%, weight 163 pounds 4.8 ounces, BMI 28.93.  Her weight is stable from August 2022.     Assessment & Plan:  Memory disorder  Hearing loss wears bilateral hearing aids  Labile hypertension-today her blood pressure is stable.  Will not treat at this point in time.  Proof of vaccinations required for citizenship.  Checking MMR status.  Flu vaccine given.  Pneumococcal 20 vaccine given.  

## 2022-05-23 ENCOUNTER — Telehealth: Payer: Self-pay | Admitting: Internal Medicine

## 2022-05-23 NOTE — Telephone Encounter (Addendum)
Natalie Williamson called back and they have decided that it would be best if their mom move into an assisted living facility, so someone can help with her medications and things. However they want her to continue coming here for primary care. I let her know normally when a patient moves into assisted living, that the facility doctor takes over care because we are not staffed to continue care for them. She is going to call and talk with Natalie Williamson, she said her mom will continue as private pay, because she does not qualify for any government pay since she is not a citizen.

## 2022-05-23 NOTE — Telephone Encounter (Signed)
Received thru fax an FL2 to be completed from [facility] and sent back to a Ollen Barges call with any questions [phone]

## 2022-05-23 NOTE — Telephone Encounter (Signed)
LVM to CB.

## 2022-05-24 ENCOUNTER — Telehealth: Payer: Self-pay

## 2022-05-24 NOTE — Telephone Encounter (Signed)
Scheduled

## 2022-05-24 NOTE — Telephone Encounter (Signed)
Daughter of patient called asking if mother could be seen today to fill out FS-2 form because patient is moving into [place] I offered an appt for Monday but that was a conflict with daughter's aerobics class. Daughter asked if patient could be seen today I informed her I would send a message

## 2022-05-27 ENCOUNTER — Ambulatory Visit (INDEPENDENT_AMBULATORY_CARE_PROVIDER_SITE_OTHER): Payer: Self-pay | Admitting: Internal Medicine

## 2022-05-27 VITALS — BP 124/68 | HR 72 | Temp 98.0°F | Ht 63.0 in | Wt 164.1 lb

## 2022-05-27 DIAGNOSIS — H903 Sensorineural hearing loss, bilateral: Secondary | ICD-10-CM

## 2022-05-27 DIAGNOSIS — R413 Other amnesia: Secondary | ICD-10-CM

## 2022-05-27 DIAGNOSIS — Z0000 Encounter for general adult medical examination without abnormal findings: Secondary | ICD-10-CM

## 2022-05-27 DIAGNOSIS — R319 Hematuria, unspecified: Secondary | ICD-10-CM

## 2022-05-27 LAB — POCT URINALYSIS DIPSTICK
Bilirubin, UA: NEGATIVE
Glucose, UA: NEGATIVE
Ketones, UA: NEGATIVE
Leukocytes, UA: NEGATIVE
Nitrite, UA: NEGATIVE
Protein, UA: NEGATIVE
Spec Grav, UA: 1.015 (ref 1.010–1.025)
Urobilinogen, UA: 0.2 E.U./dL
pH, UA: 6 (ref 5.0–8.0)

## 2022-05-27 NOTE — Progress Notes (Signed)
   Subjective:    Patient ID: Natalie Williamson, female    DOB: [birth year], 82 y.o.   MRN: [id number]  HPI 82 year old Female with history of posterior cortical atrophy and right-sided neglect previously seen in September 2023 by Dr. Felecia Shelling, Neurologist seen today in person with her daughter, Natalie Williamson,  present.  Patient will be moving from [facility] facility on [address].  Several forms need to be completed and I am asked to see her in person today.  Due to right-sided neglect, patient is eating with her hands at times.  She takes no chronic medications.  She had labs done in February 2023 including CBC with differential c-Met TSH free T4 B12 and folate all of which were normal except for very slightly elevated creatinine of 0.97.  Her glucose was normal.  Free T4 and TSH were normal as well as B12 and folate.  She takes no chronic meds.  History of sleep apnea but has not been wearing CPAP device recently.  Wears bilateral hearing aids.  History of B12 deficiency diagnosed in 2014 and is treated with oral supplementation.  She sustained a hand fracture in 2013.  She was widowed in 2007.  She has never smoked and does not drink alcohol.  Social history: She is a native of [place].  Has been residing at [facility] but this may not be a good fit for her as she is having some isolation issues.  When she eats in a group setting, she is beginning to use her hands to eat due to her visual issues and some of the other residents have remarked about this.  Her vision is restricted to the left visual field.  She has had issues finding car door handle on the right.  This came to family's attention when they noticed she was neglecting her right side.  MRI of the brain was negative for stroke.  Review of Systems she has no new issues     Objective:   Physical Exam She is cooperative.  She is pleasant.  Her blood pressure is excellent at 120/70, pulse 72 regular temperature 98 degrees  pulse oximetry 99% weight 164 pounds 1.9 ounces height 5 feet 3 inches BMI 29.07.  She follows commands.  She has no facial weakness.  Extraocular movements are full.  PERRLA.  Her chest is clear.  Her neck is supple.  She has no carotid bruits.  No thyromegaly.  Cardiac exam: Regular rate and rhythm without ectopy or murmurs.  Abdomen is soft nondistended without hepatosplenomegaly masses or tenderness.  Trace lower extremity edema consistent with dependent edema from sitting.  She is alert.  She is ambulatory.  She moves all 4 extremities and muscle strength is within normal limits.  She is not oriented to day of week, month or year.       Assessment & Plan:  History of posterior cortical atrophy seen by Dr. Felecia Shelling, Neurologist  Memory loss  Right-sided visual neglect  Plan: Form will be completed for admission to [facility] later today after reviewing her chart.  Daughter did ask that we check urinalysis today.

## 2022-05-28 LAB — URINALYSIS, MICROSCOPIC ONLY
Bacteria, UA: NONE SEEN /HPF
Hyaline Cast: NONE SEEN /LPF

## 2022-05-29 LAB — URINE CULTURE
MICRO NUMBER:: 14401888
SPECIMEN QUALITY:: ADEQUATE

## 2022-05-30 DIAGNOSIS — Z0289 Encounter for other administrative examinations: Secondary | ICD-10-CM

## 2022-05-31 NOTE — Telephone Encounter (Signed)
This message was sent via [place], a product from [employer] http://www.biscom.com/                    -------Fax Transmission Report-------  To:               Recipient at [phone] Subject:          FW: Hp Scans Result:           The transmission was successful. Explanation:      All Pages Ok Pages Sent:       7 Connect Time:     5 minutes, 11 seconds Transmit Time:    05/31/2022 10:22 Transfer Rate:    14400 Status Code:      0000 Retry Count:      0 Job Id:           [id number] Unique Id:        [id number] Fax Line:         75 Fax Server:       [id number]

## 2022-05-31 NOTE — Telephone Encounter (Signed)
FL2 is complete and has been faxed back to [facility].

## 2022-06-11 ENCOUNTER — Telehealth: Payer: Self-pay | Admitting: Internal Medicine

## 2022-06-11 NOTE — Telephone Encounter (Signed)
Called Coralyn Mark to let her know what Dr Renold Genta said, she verbalized understanding.

## 2022-06-11 NOTE — Telephone Encounter (Signed)
Natalie Williamson [phone]  Coralyn Mark called to say they think maybe Pats ring maybe coming out that holds her bladder up and she was wanting to know if you could take it out and put it back in. I let her know I did not thinks so but I would ask.

## 2022-06-16 ENCOUNTER — Encounter: Payer: Self-pay | Admitting: Internal Medicine

## 2022-06-16 NOTE — Patient Instructions (Addendum)
Daughter asked that we check patient's urine today to rule out UTI.  Not sure that she patient got a clean-catch specimen.  Squamous epithelial cells were noted on microscopic exam and urine culture revealed less than 10,000 colonies per milliliter of a gram-positive organism.  This was thought not to be a significant finding that would lead I to treat for UTI.  Form completed for admission to new residential facility.

## 2022-06-25 ENCOUNTER — Telehealth: Payer: Self-pay | Admitting: Internal Medicine

## 2022-06-25 DIAGNOSIS — Z0289 Encounter for other administrative examinations: Secondary | ICD-10-CM

## 2022-06-25 NOTE — Telephone Encounter (Signed)
This message was sent via [place], a product from [employer] http://www.biscom.com/                    -------Fax Transmission Report-------  To:               Recipient at [phone] Subject:          FW: Hp Scans Result:           The transmission was successful. Explanation:      All Pages Ok Pages Sent:       8 Connect Time:     4 minutes, 37 seconds Transmit Time:    06/25/2022 13:51 Transfer Rate:    14400 Status Code:      0000 Retry Count:      0 Job Id:           [id number] Unique Id:        [id number] Fax Line:         60 Fax Server:       [email or web address]

## 2022-06-25 NOTE — Telephone Encounter (Signed)
Received Care plan to review, sign and fax back to [facility] [phone], Phone [phone]

## 2022-08-09 ENCOUNTER — Telehealth: Payer: Self-pay | Admitting: Internal Medicine

## 2022-08-09 ENCOUNTER — Telehealth (INDEPENDENT_AMBULATORY_CARE_PROVIDER_SITE_OTHER): Payer: Self-pay | Admitting: Internal Medicine

## 2022-08-09 ENCOUNTER — Encounter: Payer: Self-pay | Admitting: Internal Medicine

## 2022-08-09 DIAGNOSIS — M545 Low back pain, unspecified: Secondary | ICD-10-CM

## 2022-08-09 NOTE — Telephone Encounter (Signed)
-------  Fax Transmission Report-------  To:               Recipient at [phone] Subject:          Fw: Hp Scans Result:           The transmission was successful. Explanation:      All Pages Ok Pages Sent:       2 Connect Time:     0 minutes, 34 seconds Transmit Time:    08/09/2022 14:44 Transfer Rate:    14400 Status Code:      0000 Retry Count:      0 Job Id:           [id number] Unique [id number] Fax Line:         26 Fax Server:       [employer]

## 2022-08-09 NOTE — Telephone Encounter (Signed)
-------  Fax Transmission Report-------  To:               Recipient at [phone] Subject:          Fw: Hp Scans Result:           The transmission was successful. Explanation:      All Pages Ok Pages Sent:       3 Connect Time:     1 minutes, 7 seconds Transmit Time:    08/09/2022 14:49 Transfer Rate:    14400 Status Code:      0000 Retry Count:      0 Job Id:           [id number] Unique [id number] Fax Line:         37 Fax Server:       [id number]

## 2022-08-09 NOTE — Telephone Encounter (Signed)
I have spoken with staff member at [facility], the facility where pt is residing.  She entered facility there around the first of the year.  Today staff reports patient is crying and complaining of back and hip pain. Daughter took patient to ED in [facility] March 11. ED physician discussed imaging studies but it was decided not to pursue these. Pt. Is a [place] citizen and does not have Medicare. They subsequently took her to a chiropractor but we have received no notes from chiropractor. Both son and daughter are out of town today. Staff is concerned about patient. We may try Aleve one tab with food twice daily. I think it is OK to order PT. We will contact daughter to see if we can get records from chiropractor.If patient condition worsens, I will recommend she be transported to [place] ED. MJB, MD  I have spent 10 minutes with chart review, medical decision making and speaking with staff at [facility] in [place]. I am at my office and patient is at this facility.

## 2022-08-09 NOTE — Telephone Encounter (Signed)
Natalie Williamson [phone]  Terri called to say her mom is having back pain and she and her brother are both out of state and there is no one to give her any medication. She wants to know if you can fax an order for the facility to give her some Tylenol or Advil for pain because they can not give her anything without an order and since you are her doctor on record they need it to come from you. I did explain this is why it is good for patient to see doctor at facility.  Last Sunday her brother took mom out and she slid on door of truck and hurt her back, they took her too chiropractor and it is just going to take a little time to heal. Part of pain is injury and part is mental status according to [place].  Fax # [phone]

## 2022-08-09 NOTE — Telephone Encounter (Signed)
[facility] and they said to fax prescription over to [phone]. I fax over a prescription for Aleve and Tylenol.

## 2022-08-09 NOTE — Telephone Encounter (Signed)
Called and spoke with Natalie Williamson and ask her to get in touch with the Chiropractor that seen her mother and have them fax over any notes or Xray results for Dr Renold Genta to go over. She said she would take care of that today or Monday. She did say they did X-Rays and nothing was broken or fractured.  I also mentioned again that it might be time to look into the facility doctor taking over her moms care, so her mom is not left with no one being able to see her while they are out of town or bring her to our to see Dr Renold Genta. She agreed, said she didn't think her mom would go down hill so quickly and will talk to them when she gets back into town.

## 2022-08-10 ENCOUNTER — Encounter: Payer: Self-pay | Admitting: Internal Medicine

## 2022-08-10 ENCOUNTER — Telehealth (INDEPENDENT_AMBULATORY_CARE_PROVIDER_SITE_OTHER): Payer: Self-pay | Admitting: Internal Medicine

## 2022-08-10 DIAGNOSIS — M5430 Sciatica, unspecified side: Secondary | ICD-10-CM

## 2022-08-10 DIAGNOSIS — F411 Generalized anxiety disorder: Secondary | ICD-10-CM

## 2022-08-10 DIAGNOSIS — R413 Other amnesia: Secondary | ICD-10-CM

## 2022-08-10 DIAGNOSIS — H903 Sensorineural hearing loss, bilateral: Secondary | ICD-10-CM

## 2022-08-10 MED ORDER — METHYLPREDNISOLONE 4 MG PO TABS: ORAL_TABLET | ORAL | 0 refills | Status: DC

## 2022-08-10 MED ORDER — SENNA-DOCUSATE SODIUM 8.6-50 MG PO TABS: ORAL_TABLET | ORAL | 2 refills | Status: DC

## 2022-08-10 MED ORDER — ALPRAZOLAM 0.25 MG PO TABS: 0.2500 mg | ORAL_TABLET | Freq: Three times a day (TID) | ORAL | 0 refills | Status: DC | PRN

## 2022-08-10 MED ORDER — HYDROCODONE-ACETAMINOPHEN 5-325 MG PO TABS: 1.0000 | ORAL_TABLET | Freq: Four times a day (QID) | ORAL | 0 refills | Status: AC | PRN

## 2022-08-10 NOTE — Telephone Encounter (Signed)
I received a telephone call from the answering service regarding Natalie Williamson ,date of birth [birth year], who is currently residing in [facility] facility on [address] in [place] semi-independently with supervision. She is a patient is this practice for the past several years.  Answering service indicated that [place], at [facility] needed to speak with me urgently regarding Natalie Williamson.  Patient is having excruciating back pain and crying out in pain.  She is disturbing other residents according to Natalie Williamson, Natalie Williamson, Natalie Williamson in charge at [place].  Yesterday, we received a call about patient having back pain which was the first time we had heard of this issue.  According to her daughter, Natalie Williamson who today was on a three-way call with Natalie Williamson and myself today, patient was diagnosed with a pinched nerve in her back by Natalie Williamson, chiropractor recently.  Seems to be having more pain recently.  Natalie Williamson reports that Natalie Williamson did x-rays at his office and there was no mention of a lumbar compression fracture but she was diagnosed with a "pinched nerve ".  Today we have discussed the possibility of physical therapy within the facility.  We also need urgent attention to her pain management.  We discussed options.  If she has a "pinched nerve "which I interpret that to be sciatica then I think she would benefit from a tapering course of Medrol 4 mg (6-day Medrol Dosepak).  She also may need scheduled Senokot to prevent constipation from narcotic pain medication.  Also, Natalie Williamson thinks she may benefit from some anti-anxiety medication such as low-dose Xanax.  She is also going to receive Norco 5/325 sparingly up to every 8 hours with food for pain control.  This may contribute to constipation, confusion and lethargy but pain needs to be managed.  Daughter, Natalie Williamson will request records be sent here from Natalie Williamson' office.  For now we are going to try to  manage this issue within the facility and not send her to the emergency department for evaluation.  B" no she can contact me if she has further questions or concerns.  I have spent 30 minutes speaking with the patient's daughter on the phone, reviewing medications and E chart as well as E- prescribing medications including narcotic pain medication.  We are generally only able to provide a 5-day supply of narcotic pain med due to current guidelines.  We are sending in [name] (#15 tablets) 1 tablet with food every 8 hours as needed for severe back pain.  We are sending in a Medrol Dosepak 4 mg starting with 6 tablets day 1 and decreasing by 1 tablet daily i.e. 6-5-4-3-2-1 taper and  also advising use of Senokot once or twice daily to manage constipation associated with narcotic pain medication.  May also use Xanax 0.25 mg up to 3 times daily sparingly as needed for anxiety.Rx for Senekot  S sent in expecting constipation with narcotic pain med.Order sent for PT.  Patient has memory loss and sensorineural hearing loss.  Patient is at [facility] living facility, daughter is driving back from [place] and is currently driving through [place].  Natalie Williamson is at [facility] living facility with patient.  In order to send narcotic pain medication electronically, I am at my office.  Patient's daughter is agreeable to speaking with me in this format today.  Patient has dementia,  lives in a room/studio apartment  at the facility and is unable to come to the phone.

## 2022-08-15 ENCOUNTER — Encounter: Payer: Self-pay | Admitting: Internal Medicine

## 2022-08-21 ENCOUNTER — Encounter: Payer: Self-pay | Admitting: Medical

## 2022-08-21 ENCOUNTER — Ambulatory Visit
Admission: RE | Admit: 2022-08-21 | Discharge: 2022-08-21 | Disposition: A | Discharge status: Home / Self Care | Payer: No Typology Code available for payment source | Source: Ambulatory Visit | Attending: Medical | Admitting: Medical

## 2022-08-21 ENCOUNTER — Other Ambulatory Visit: Payer: Self-pay | Admitting: Medical

## 2022-08-21 DIAGNOSIS — M5451 Vertebrogenic low back pain: Secondary | ICD-10-CM

## 2022-08-23 ENCOUNTER — Telehealth: Payer: Self-pay | Admitting: Internal Medicine

## 2022-08-23 NOTE — Telephone Encounter (Signed)
Received Care Plan and orders from [facility] to be reviewed signed and faxed back per [facility] regulations. Gustavo Lah RN is contact person Phone number [phone] Fax [phone]  Personal Service Plan Healthcare order Sheet Active order sheet dated 08/22/2022 Letter for facility to take over care as PCP on 09/18/2022

## 2022-08-23 NOTE — Telephone Encounter (Signed)
   To:               Recipient at [phone] Subject:          Fw: Hp Scans Result:           The transmission was successful. Explanation:      All Pages Ok Pages Sent:       11 Connect Time:     6 minutes, 8 seconds Transmit Time:    08/23/2022 10:58 Transfer Rate:    14400 Status Code:      0000 Retry Count:      0 Job Id:           [id number] Unique Id:        [id number] Fax Line:         2 Fax Server:       [employer]

## 2022-08-26 ENCOUNTER — Telehealth: Payer: Self-pay | Admitting: Internal Medicine

## 2022-08-26 NOTE — Telephone Encounter (Signed)
I called back and talked with Natalie Williamson and she said, that Natalie Williamson was still having some pain at times. The family is still back and forth on what is wrong, she has not seen any documentation from doctors on what is really wrong. Son does not really want her to have Physical therapy says he is going to have more testing. They still have not signed the papers to have new doctor to take over care. I let her know you were willing to do this refill one time but that is it. She does still have some of both medication, however I let her know we did not want her to run out on a weekend, because you could not fill them on a weekend again. We could do this one now and they could have on file. For when she needs it. That you would no longer be her doctor as of Sep 18, 2022. She said she certainly understood.

## 2022-08-26 NOTE — Telephone Encounter (Signed)
                  -------  Fax Transmission Report-------  To:               Recipient at [phone] Subject:          Fw: Hp Scans Result:           The transmission was successful. Explanation:      All Pages Ok Pages Sent:       4 Connect Time:     2 minutes, 4 seconds Transmit Time:    08/26/2022 16:29 Transfer Rate:    14400 Status Code:      0000 Retry Count:      0 Job Id:           [id number] Unique Id:        [id number] Fax Line:         44 Fax Server:       [id number]

## 2022-08-26 NOTE — Telephone Encounter (Signed)
Faxed 2 RX for [place] to hold onto.

## 2022-08-26 NOTE — Telephone Encounter (Signed)
Received a fax for below prescriptions from   Called and left a message for below person to call with an update on patient and let us know the status of the request of the below medications.  Gustavo Lah RN  Phone number [phone] Fax [phone]   [facility] [address] [place], [place] [address] Phone [phone] Fax [phone]  ALPRAZolam Prudy Feeler) 0.25 MG tablet   HYDROcodone-acetaminophen (NORCO) 5-325 MG tablet

## 2023-01-29 ENCOUNTER — Ambulatory Visit: Payer: Self-pay | Admitting: Neurology

## 2023-02-05 ENCOUNTER — Ambulatory Visit: Payer: Self-pay | Admitting: Neurology

## 2023-08-07 ENCOUNTER — Ambulatory Visit (INDEPENDENT_AMBULATORY_CARE_PROVIDER_SITE_OTHER): Payer: Self-pay | Admitting: Neurology

## 2023-08-07 ENCOUNTER — Encounter: Payer: Self-pay | Admitting: Neurology

## 2023-08-07 VITALS — BP 130/60 | Resp 15 | Ht 63.0 in | Wt 141.5 lb

## 2023-08-07 DIAGNOSIS — G4733 Obstructive sleep apnea (adult) (pediatric): Secondary | ICD-10-CM

## 2023-08-07 DIAGNOSIS — G319 Degenerative disease of nervous system, unspecified: Secondary | ICD-10-CM

## 2023-08-07 DIAGNOSIS — R413 Other amnesia: Secondary | ICD-10-CM

## 2023-08-07 NOTE — Patient Instructions (Signed)
 Natalie Williamson

## 2023-08-07 NOTE — Progress Notes (Signed)
 [facility]  PATIENT: Natalie Williamson DOB: [birth year]  REFERRING DOCTOR OR PCP: Marlan Palau, MD SOURCE: Patient, notes from primary care, imaging and lab reports, MRI images personally reviewed.  _________________________________   HISTORICAL  CHIEF COMPLAINT:  Chief Complaint  Patient presents with   Follow-up    Rm10, son present, Posterior cortical atrophy Memory loss (NO TEST) Right-sided visual neglect: pt son stated that she is getting progressively worse, specifically in the eyes with improper depth perception and blind spots.     HISTORY OF PRESENT ILLNESS:  Natalie Williamson is an 83 year old woman who notes some problems with memory and other cognitive changes.     UPDATE 08/07/2023 Her son notes that she has continued to progress and is in a memory care unit at [facility].   She had gone from independent care to assisted living to memory care since the last visit.   She is walking well .  She had a fall getting out of a truck. No fracture but leg was injured.   Strength and sensation are fine   Donepezil had GI issues and Memantine caused hallucinations so she is not on any medication.       She has poor ability to find things with her eyes.  She can not read.  She has trouble using utensils and eating so uses her hands.   MRI brain 07/10/2021.   It showed mild to moderate generalized atrophy and mild ronic microvascular ischemic changes.    In assisted living she had often felt someone was in the room with her but that is not currently happening.     She does not like crowds and gets aggravated easily but does not have agitation.    She has a lot of word finding difficulties.  She does interact with some of the other ladies in her unit and participates in activities  She stays active and walks well.  She is eating eating well.  She has CPAP but has not used in 2 years.  In 2016, she had severe OSA but HST 03/05/2021 showed moderate at AHI=15.6.     She got a new CPAP but has not used.  She does not have EDS.     She denies depression or anxiety.   Cognitive History Her daughter notes she had difficuly with memory in 2016.   She went to [facility] and had a PET scan.   It showed hypometabolism in the left parietal, left posterior temporal, and left lateral occipital lobe is concerning for posterior cerebral atrophy.    At that time, she scored 26/30 on the MMSE .    Symptoms slow;y progressed.   In 2022, she was noted to neglect the right visual field.    MRI showed no stroke that could explain it.    She had been on donepezil in and rivastigmine n the past but had GI issues.   Memantine was prescribed in the past but she has not taken it.      07/23/2021   11:33 AM  MMSE - Mini Mental State Exam  Orientation to time 0  Orientation to Place 1  Registration 0  Attention/ Calculation 0  Recall 0  Language- name 2 objects 2  Language- repeat 0  Language- follow 3 step command 1  Language- read & follow direction 0  Write a sentence 0  Copy design 0  Total score 4     IMAGES Personally reviewed: MRI brain 07/10/2021 showed mild  to moderate chronic microvascular ischemic changes.   Mild atrophy.   No acute findings.    REVIEW OF SYSTEMS: Constitutional: No fevers, chills, sweats, or change in appetite Eyes: No visual changes, double vision, eye pain Ear, nose and throat: No hearing loss, ear pain, nasal congestion, sore throat.  As above Cardiovascular: No chest pain, palpitations Respiratory:  No shortness of breath at rest or with exertion.   No wheezes GastrointestinaI: No nausea, vomiting, diarrhea, abdominal pain, fecal incontinence Genitourinary:  No dysuria, urinary retention or frequency.  No nocturia. Musculoskeletal:  No neck pain, back pain Integumentary: No rash, pruritus, skin lesions Neurological: as above Psychiatric: No depression at this time.  No anxiety Endocrine: No palpitations, diaphoresis, change in  appetite, change in weigh or increased thirst Hematologic/Lymphatic:  No anemia, purpura, petechiae. Allergic/Immunologic: No itchy/runny eyes, nasal congestion, recent allergic reactions, rashes  ALLERGIES: Allergies  Allergen Reactions   Other Hives    Heat    HOME MEDICATIONS: No current outpatient medications on file.  PAST MEDICAL HISTORY: Past Medical History:  Diagnosis Date   Alzheimer disease (HCC) 2016   Hearing difficulty    Memory loss    Vision loss     PAST SURGICAL HISTORY: Past Surgical History:  Procedure Laterality Date   BREAST SURGERY      FAMILY HISTORY: Family History  Problem Relation Age of Onset   Arthritis Mother    Diabetes Mother    Headache Mother    Cancer Father     SOCIAL HISTORY:  Social History   Socioeconomic History   Marital status: Married    Spouse name: Not on file   Number of children: Not on file   Years of education: Not on file   Highest education level: High school graduate  Occupational History   Not on file  Tobacco Use   Smoking status: Never   Smokeless tobacco: Never  Substance and Sexual Activity   Alcohol use: No   Drug use: No   Sexual activity: Not on file  Other Topics Concern   Not on file  Social History Narrative   Lives alone in [place]   Right handed   Social Drivers of Health   Financial Resource Strain: Not on file  Food Insecurity: Not on file  Transportation Needs: Not on file  Physical Activity: Not on file  Stress: Not on file  Social Connections: Not on file  Intimate Partner Violence: Not on file     PHYSICAL EXAM  Vitals:   08/07/23 1417  BP: 130/60  Resp: 15  Weight: 141 lb 8 oz (64.2 kg)  Height: 5\' 3"  (1.6 m)    Body mass index is 25.07 kg/m.   General: The patient is well-developed and well-nourished and in no acute distress  HEENT:  Head is [place]/AT.  Sclera are anicteric.   Skin: Extremities are without rash or  edema.   Neurologic Exam  Mental  status: She is alert and oriented to name but not to place or time.  She has poor focus/attention.  Reduced short-term memory..   She made some word finding errors  Vision: She has reduced visual field right greater than left and has a denser right visual neglect.  Cranial nerves: Extraocular movements are full. Pupils are equal, round, and reactive to light and accomodation.    Facial symmetry is present. There is good facial sensation to soft touch bilaterally.Facial strength is normal.  Trapezius and sternocleidomastoid strength is normal. No dysarthria is  noted.    Mild hearing deficits are noted.  Motor:  Muscle bulk is normal.   Tone is normal. Strength is  5 / 5 in all 4 extremities.   Sensory: She has a right sensory neglect.  Sensory testing is intact to pinprick, soft touch and vibration sensation in all 4 extremities.  Coordination: Cerebellar testing reveals good finger-nose-finger and heel-to-shin bilaterally.  Gait and station: Station is normal.   Gait is normal for age.  Tandem gait is mildly wide but normal for age Romberg is negative.   Reflexes: Deep tendon reflexes are symmetric and normal bilaterally.        DIAGNOSTIC DATA (LABS, IMAGING, TESTING) - I reviewed patient records, labs, notes, testing and imaging myself where available.  Lab Results  Component Value Date   WBC 4.3 07/12/2021   HGB 12.2 07/12/2021   HCT 36.2 07/12/2021   MCV 89.6 07/12/2021   PLT 211 07/12/2021      Component Value Date/Time   NA 142 07/12/2021 1251   K 4.1 07/12/2021 1251   CL 107 07/12/2021 1251   CO2 30 07/12/2021 1251   GLUCOSE 89 07/12/2021 1251   BUN 17 07/12/2021 1251   CREATININE 0.97 (H) 07/12/2021 1251   CALCIUM 9.2 07/12/2021 1251   PROT 7.0 07/12/2021 1251   ALBUMIN 4.5 06/14/2014 1214   AST 16 07/12/2021 1251   ALT 10 07/12/2021 1251   ALKPHOS 56 06/14/2014 1214   BILITOT 0.9 07/12/2021 1251   GFRNONAA 48 (L) 10/19/2020 0912   GFRAA 56 (L) 10/19/2020 0912    Lab Results  Component Value Date   CHOL 239 (H) 10/19/2020   HDL 56 10/19/2020   LDLCALC 163 (H) 10/19/2020   TRIG 94 10/19/2020   CHOLHDL 4.3 10/19/2020   No results found for: "HGBA1C" Lab Results  Component Value Date   VITAMINB12 264 07/12/2021   Lab Results  Component Value Date   TSH 0.69 07/12/2021       ASSESSMENT AND PLAN  Posterior cortical atrophy (HCC)  Memory loss  Obstructive sleep apnea   She has moderate dementia and 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    We discussed physical and social activity likely helps more than most of the med's.    Not a candidate for anti-amyloid drugs due to severity and atypical presentation.   Stay active and try to eat healthy She is not using CPAP but does not have sleepiness.  Rtc 1 year or sooner if new or worsening symptoms.  This visit is part of a comprehensive longitudinal care medical relationship regarding the patients primary diagnosis of dementia and related concerns.  Harun Brumley A. Epimenio Foot, MD, [facility] 08/07/2023, 2:58 PM Certified in Neurology, Clinical Neurophysiology, Sleep Medicine and Neuroimaging  [facility] [address], [address] [place], [place] [address] [phone]

## 2023-08-20 IMAGING — MR MR HEAD WO/W CM
12 series · 48 of 48 positions shown · IV contrast (multihance)
Comparison: CTA April 05, 2015.

CLINICAL DATA: Right homonymous hemianopsia PO7.24P ([id number])

EXAM:
MRI HEAD WITHOUT AND WITH CONTRAST
TECHNIQUE: Multiplanar, multiecho pulse sequences of the brain and surrounding
structures were obtained without and with intravenous contrast.
CONTRAST:  15mL MULTIHANCE GADOBENATE DIMEGLUMINE 529 MG/ML IV SOLN

[Series 2: T1 · sagittal · 5.0mm · 0.45mm/px · 2 of 23 slices shown]
[im 1/23]
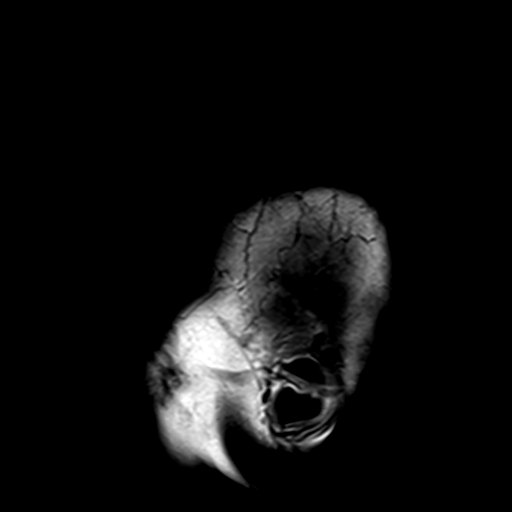
[im 23/23]
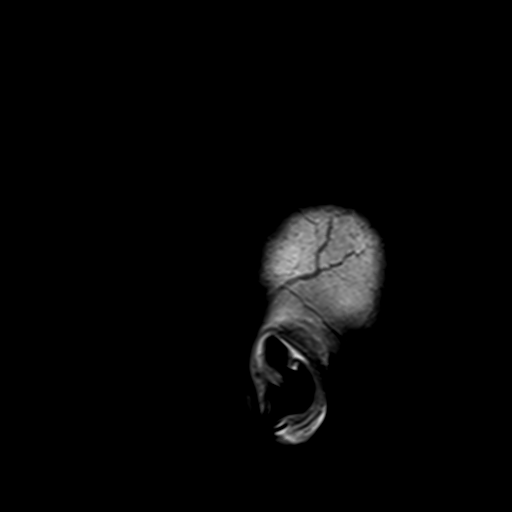

[Series 3: DWI · axial · 3.0mm · 1.80mm/px · z∈[-109,+35]mm · 7 of 99 slices shown (1 of 4)]
[im 1/99]
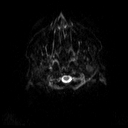
[im 17/99]
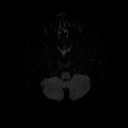
[im 33/99]
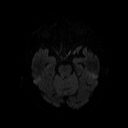
[im 50/99]
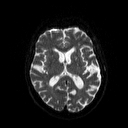
[im 66/99]
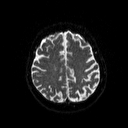
[im 82/99]
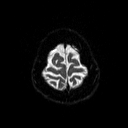
[im 99/99]
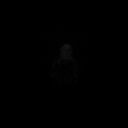

[Series 4: DWI · axial · 3.0mm · 1.80mm/px · z∈[-109,+35]mm · 3 of 50 slices shown (2 of 4)]
[im 1/50]
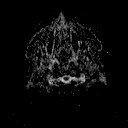
[im 25/50]
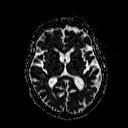
[im 50/50]
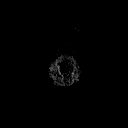

[Series 5: DWI · coronal · 5.0mm · 1.80mm/px · 5 of 72 slices shown (3 of 4)]
[im 1/72]
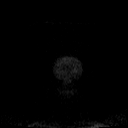
[im 18/72]
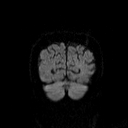
[im 36/72]
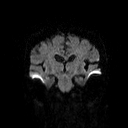
[im 54/72]
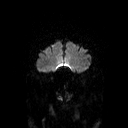
[im 72/72]
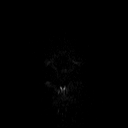

[Series 6: DWI · coronal · 5.0mm · 1.80mm/px · 2 of 36 slices shown (4 of 4)]
[im 1/36]
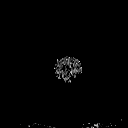
[im 36/36]
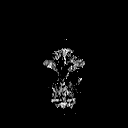

[Series 7: T2 · axial · 5.0mm · 0.60mm/px · 1 of 23 slices shown (1 of 2)]
[im 1/23]
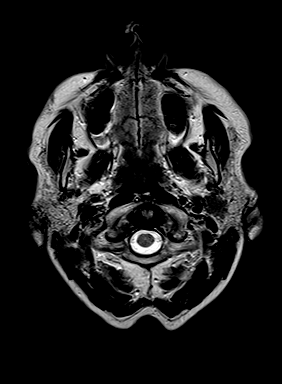

[Series 8: FLAIR · axial · 3.0mm · 0.47mm/px · z∈[-108,+37]mm · 2 of 33 slices shown]
[im 1/33]
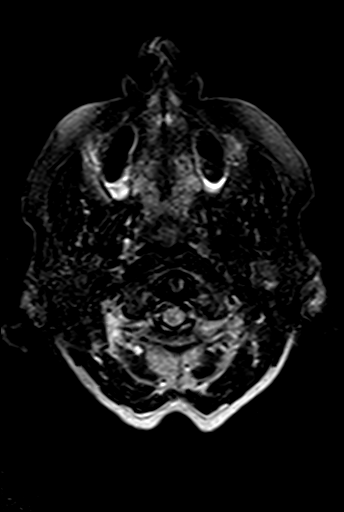
[im 33/33]
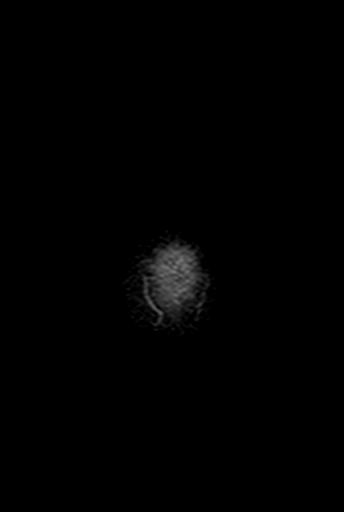

[Series 10: swi_images · axial · 4.0mm · 0.94mm/px · z∈[-103,+33]mm · 2 of 36 slices shown]
[im 1/36]
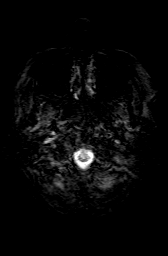
[im 36/36]
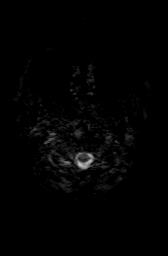

[Series 11: t1_mpr_tra · axial · 1.0mm · 0.78mm/px · z∈[-112,+43]mm · 10 of 160 slices shown]
[im 1/160]
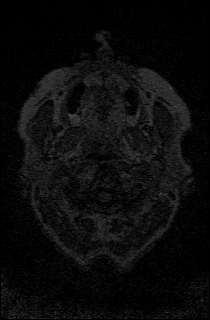
[im 18/160]
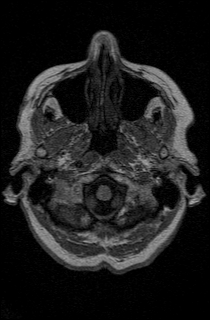
[im 36/160]
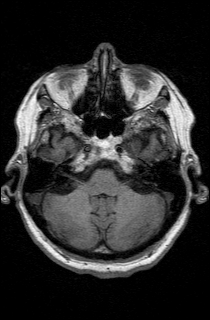
[im 54/160]
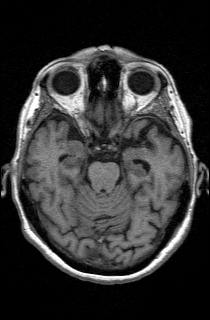
[im 71/160]
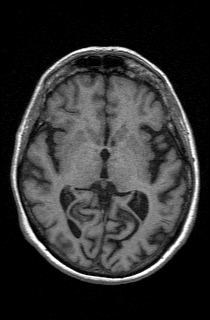
[im 89/160]
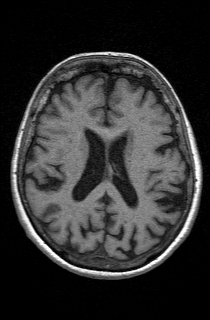
[im 107/160]
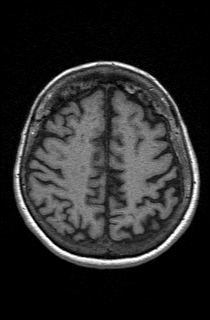
[im 124/160]
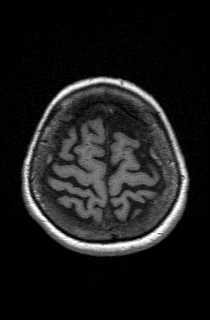
[im 142/160]
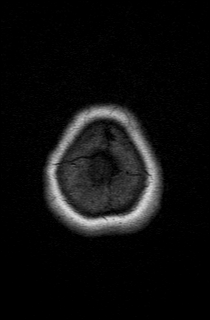
[im 160/160]
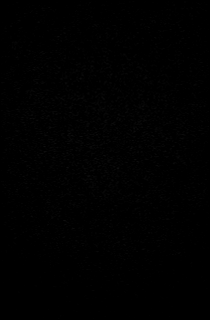

[Series 12: T2 · coronal · 5.0mm · 0.45mm/px · 2 of 29 slices shown (2 of 2)]
[im 1/29]
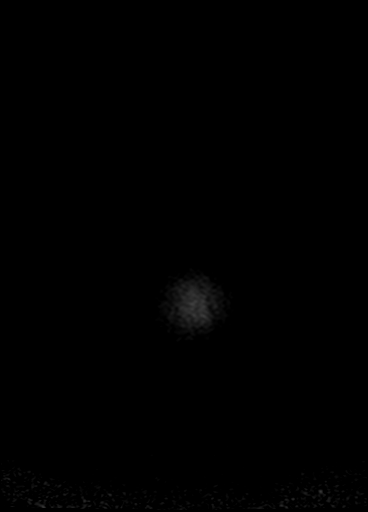
[im 29/29]
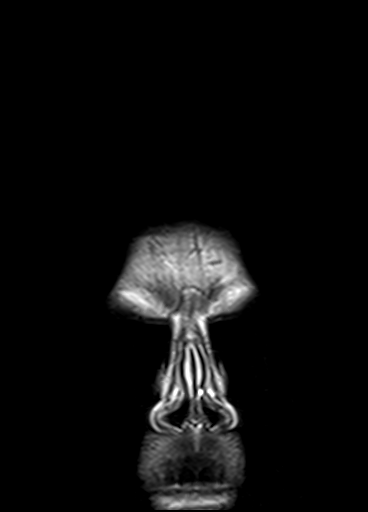

[Series 13: t1_mpr_tra post · axial · 1.0mm · 0.78mm/px · z∈[-112,+43]mm · 10 of 160 slices shown]
[im 1/160]
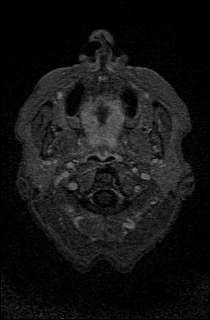
[im 18/160]
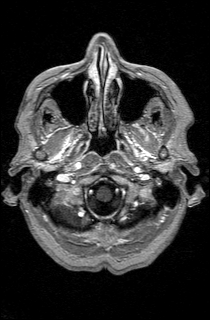
[im 36/160]
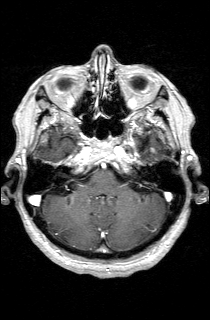
[im 54/160]
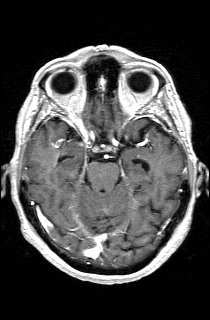
[im 71/160]
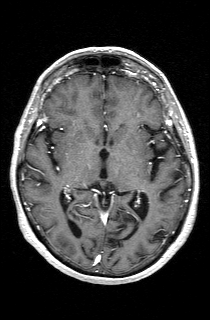
[im 89/160]
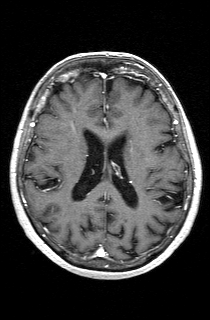
[im 107/160]
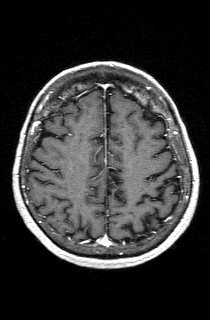
[im 124/160]
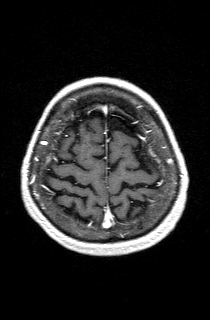
[im 142/160]
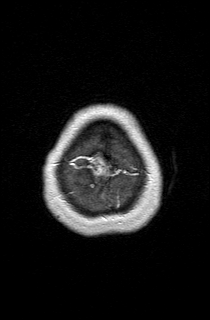
[im 160/160]
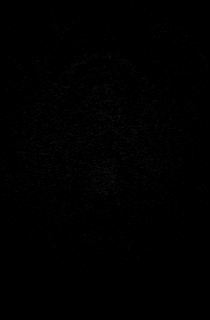

[Series 14: post cor · coronal · 5.0mm · 0.45mm/px · 2 of 29 slices shown]
[im 1/29]
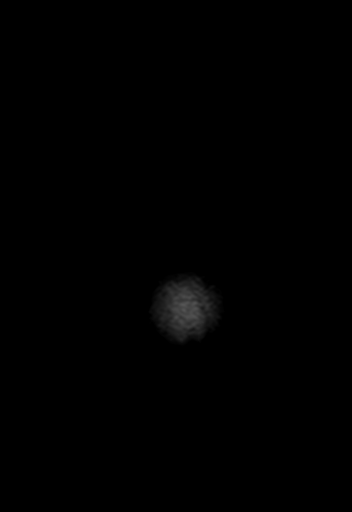
[im 29/29]
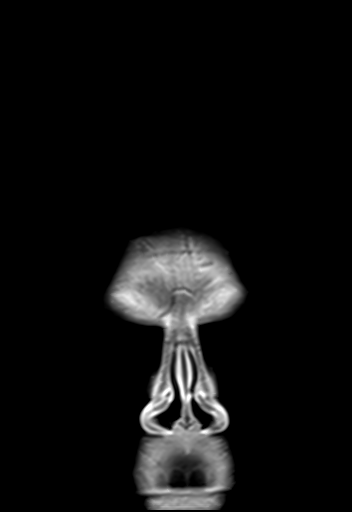

[48 of 48 positions shown; findings below may reference images not displayed]

FINDINGS: Brain: No acute infarction, hemorrhage, hydrocephalus, extra-axial
collection or mass lesion. Mild to moderate scattered T2/FLAIR
hyperintensities in the white matter, nonspecific but compatible
with chronic microvascular disease. No abnormal enhancement.

Vascular: Major arterial flow voids are maintained at the skull
base.

Skull and upper cervical spine: Normal marrow signal.

Sinuses/Orbits: Mild paranasal sinus mucosal thickening.
Unremarkable orbits.

Other: No mastoid effusions.
IMPRESSION: 1. No evidence of acute intracranial abnormality.
2. Mild to moderate chronic microvascular ischemic disease.

## 2024-04-19 ENCOUNTER — Telehealth: Payer: Self-pay | Admitting: "Endocrinology
